# Patient Record
Sex: Female | Born: 1975
Health system: Southern US, Community
[De-identification: ages and names within clinical notes are randomized; demographics above are authoritative.]

## PROBLEM LIST (undated history)

## (undated) DIAGNOSIS — N926 Irregular menstruation, unspecified: Secondary | ICD-10-CM

## (undated) DIAGNOSIS — N920 Excessive and frequent menstruation with regular cycle: Secondary | ICD-10-CM

## (undated) DIAGNOSIS — E559 Vitamin D deficiency, unspecified: Secondary | ICD-10-CM

## (undated) DIAGNOSIS — G43909 Migraine, unspecified, not intractable, without status migrainosus: Secondary | ICD-10-CM

## (undated) DIAGNOSIS — D649 Anemia, unspecified: Secondary | ICD-10-CM

## (undated) HISTORY — DX: Anemia, unspecified: D64.9

## (undated) HISTORY — DX: Excessive and frequent menstruation with regular cycle: N92.0

## (undated) HISTORY — DX: Irregular menstruation, unspecified: N92.6

## (undated) HISTORY — DX: Migraine, unspecified, not intractable, without status migrainosus: G43.909

## (undated) HISTORY — DX: Vitamin D deficiency, unspecified: E55.9

## (undated) HISTORY — PX: SPINE SURGERY: SHX786

---
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
## 1998-06-01 HISTORY — PX: PILONIDAL CYST EXCISION: SHX744

## 1999-11-11 ENCOUNTER — Other Ambulatory Visit: Admission: RE | Admit: 1999-11-11 | Discharge: 1999-11-11 | Payer: Self-pay | Admitting: Gynecology

## 2001-01-06 ENCOUNTER — Other Ambulatory Visit: Admission: RE | Admit: 2001-01-06 | Discharge: 2001-01-06 | Payer: Self-pay | Admitting: Gynecology

## 2001-12-15 DIAGNOSIS — J309 Allergic rhinitis, unspecified: Secondary | ICD-10-CM | POA: Insufficient documentation

## 2004-03-22 ENCOUNTER — Inpatient Hospital Stay: Payer: Self-pay

## 2008-07-21 DIAGNOSIS — N926 Irregular menstruation, unspecified: Secondary | ICD-10-CM | POA: Insufficient documentation

## 2008-07-30 DIAGNOSIS — E559 Vitamin D deficiency, unspecified: Secondary | ICD-10-CM | POA: Insufficient documentation

## 2009-11-27 ENCOUNTER — Ambulatory Visit: Payer: Self-pay

## 2009-11-28 ENCOUNTER — Ambulatory Visit: Payer: Self-pay | Admitting: Family Medicine

## 2011-06-02 HISTORY — PX: DILATION AND CURETTAGE OF UTERUS: SHX78

## 2011-12-18 LAB — BASIC METABOLIC PANEL
BUN: 13 mg/dL (ref 4–21)
Creatinine: 0.8 mg/dL (ref <=1.1)
Glucose: 87 mg/dL
POTASSIUM: 3.7 mmol/L (ref 3.4–5.3)
Sodium: 138 mmol/L (ref 137–147)

## 2011-12-18 LAB — HEPATIC FUNCTION PANEL
ALT: 11 U/L (ref 7–35)
AST: 11 U/L — AB (ref 13–35)

## 2012-01-05 LAB — HM PAP SMEAR: HM Pap smear: NEGATIVE

## 2012-02-03 ENCOUNTER — Other Ambulatory Visit: Payer: Self-pay | Admitting: Physician Assistant

## 2012-02-03 LAB — CBC WITH DIFFERENTIAL/PLATELET
Basophil #: 0.1 10*3/uL (ref 0.0–0.1)
Basophil %: 1.1 %
Eosinophil #: 0.1 10*3/uL (ref 0.0–0.7)
HCT: 35.6 % (ref 35.0–47.0)
HGB: 12.1 g/dL (ref 12.0–16.0)
Lymphocyte #: 2.2 10*3/uL (ref 1.0–3.6)
Lymphocyte %: 32 %
MCV: 83 fL (ref 80–100)
Monocyte #: 0.4 x10 3/mm (ref 0.2–0.9)
Monocyte %: 6.2 %
Neutrophil %: 59.7 %
RBC: 4.31 10*6/uL (ref 3.80–5.20)
WBC: 7 10*3/uL (ref 3.6–11.0)

## 2013-08-18 LAB — CBC AND DIFFERENTIAL
HCT: 35 % — AB (ref 36–46)
Hemoglobin: 11.4 g/dL — AB (ref 12.0–16.0)
NEUTROS ABS: 4 /uL
PLATELETS: 385 10*3/uL (ref 150–399)
WBC: 6.7 10^3/mL

## 2015-08-02 ENCOUNTER — Telehealth: Payer: Self-pay | Admitting: Nurse Practitioner

## 2015-08-02 DIAGNOSIS — J111 Influenza due to unidentified influenza virus with other respiratory manifestations: Secondary | ICD-10-CM

## 2015-08-02 MED ORDER — OSELTAMIVIR PHOSPHATE 75 MG PO CAPS: 75.0000 mg | ORAL_CAPSULE | Freq: Two times a day (BID) | ORAL | Status: DC

## 2015-08-02 NOTE — Progress Notes (Signed)
E visit for Flu like symptoms   We are sorry that you are not feeling well.  Here is how we plan to help! Based on what you have shared with me it looks like you may have possible exposure to a virus that causes influenza.  Influenza or "the flu" is   an infection caused by a respiratory virus. The flu virus is highly contagious and persons who did not receive their yearly flu vaccination may "catch" the flu from close contact.  We have anti-viral medications to treat the viruses that cause this infection. They are not a "cure" and only shorten the course of the infection. These prescriptions are most effective when they are given within the first 2 days of "flu" symptoms. Antiviral medication are indicated if you have a high risk of complications from the flu. You should  also consider an antiviral medication if you are in close contact with someone who is at risk. These medications can help patients avoid complications from the flu  but have side effects that you should know. Possible side effects from Tamiflu or oseltamivir include nausea, vomiting, diarrhea, dizziness, headaches, eye redness, sleep problems or other respiratory symptoms. You should not take Tamiflu if you have an allergy to oseltamivir or any to the ingredients in Tamiflu.  Based upon your symptoms and potential risk factors I have prescribed Oseltamivir (Tamiflu).  It has been sent to your designated pharmacy.  You will take one 75 mg capsule orally twice a day for the next 5 days.  ANYONE WHO HAS FLU SYMPTOMS SHOULD: . Stay home. The flu is highly contagious and going out or to work exposes others! . Be sure to drink plenty of fluids. Water is fine as well as fruit juices, sodas and electrolyte beverages. You may want to stay away from caffeine or alcohol. If you are nauseated, try taking small sips of liquids. How do you know if you are getting enough fluid? Your urine should be a pale yellow or almost colorless. . Get  rest. . Taking a steamy shower or using a humidifier may help nasal congestion and ease sore throat pain. Using a saline nasal spray works much the same way. . Cough drops, hard candies and sore throat lozenges may ease your cough. . Line up a caregiver. Have someone check on you regularly.   GET HELP RIGHT AWAY IF: . You cannot keep down liquids or your medications. . You become short of breath . Your fell like you are going to pass out or loose consciousness. . Your symptoms persist after you have completed your treatment plan MAKE SURE YOU   Understand these instructions.  Will watch your condition.  Will get help right away if you are not doing well or get worse.  Your e-visit answers were reviewed by a board certified advanced clinical practitioner to complete your personal care plan.  Depending on the condition, your plan could have included both over the counter or prescription medications.  If there is a problem please reply  once you have received a response from your provider.  Your safety is important to [place].  If you have drug allergies check your prescription carefully.    You can use MyChart to ask questions about today's visit, request a non-urgent call back, or ask for a work or school excuse for 24 hours related to this e-Visit. If it has been greater than 24 hours you will need to follow up with your provider, or enter a new  e-Visit to address those concerns.  You will get an e-mail in the next two days asking about your experience.  I hope that your e-visit has been valuable and will speed your recovery. Thank you for using e-visits.

## 2015-09-03 DIAGNOSIS — D649 Anemia, unspecified: Secondary | ICD-10-CM | POA: Insufficient documentation

## 2015-09-04 ENCOUNTER — Ambulatory Visit (INDEPENDENT_AMBULATORY_CARE_PROVIDER_SITE_OTHER): Payer: BLUE CROSS/BLUE SHIELD | Admitting: Physician Assistant

## 2015-09-04 ENCOUNTER — Encounter: Payer: Self-pay | Admitting: Physician Assistant

## 2015-09-04 VITALS — BP 118/70 | HR 66 | Temp 98.4°F | Resp 16 | Wt 161.0 lb

## 2015-09-04 DIAGNOSIS — L509 Urticaria, unspecified: Secondary | ICD-10-CM

## 2015-09-04 MED ORDER — PREDNISONE 10 MG PO TABS: 10.0000 mg | ORAL_TABLET | Freq: Three times a day (TID) | ORAL | Status: DC

## 2015-09-04 MED ORDER — METHYLPREDNISOLONE ACETATE 80 MG/ML IJ SUSP
80.0000 mg | Freq: Once | INTRAMUSCULAR | Status: AC
  Administered 2015-09-04: 80 mg via INTRAMUSCULAR

## 2015-09-04 NOTE — Progress Notes (Signed)
       Patient: Diane Curry Female    DOB: 09-07-1975   40 y.o.   MRN: [id number] Visit Date: 09/04/2015  Today's Provider: Mar Daring, PA-C   Chief Complaint  Patient presents with  . Itching on arms and legs   Subjective:    HPI  Patient is here today with c/o her arms and legs itching, started Friday night. Sometimes she has white spots,sometimes red, but no rash visible. She has tried Claritin and Benadryl at night. She took some Prednisone that she had left together with the Claritin and it worked good. She try changing her laundry detergent to hypoallergenic and lotions since this started to see if there was any difference. This happened after she was free from any medication that she was taking. She doesn't feel like this is an allergic reaction. She states one of the worst times was when she got out of the shower she was very splotchy and itchy.      No Known Allergies Previous Medications   CELECOXIB (CELEBREX) 200 MG CAPSULE    Take by mouth. Reported on 09/04/2015   FEXOFENADINE (ALLEGRA ALLERGY) 180 MG TABLET    Take by mouth. Reported on 09/04/2015   MULTIPLE VITAMIN PO        Review of Systems  Constitutional: Negative.  Negative for fever, chills and fatigue.  Respiratory: Negative.   Cardiovascular: Negative.   Gastrointestinal: Negative.   Skin: Negative for rash.    Social History  Substance Use Topics  . Smoking status: Never Smoker   . Smokeless tobacco: Not on file  . Alcohol Use: Yes     Comment: Once per week; wine or mixed drinks   Objective:   BP 118/70 mmHg  Pulse 66  Temp(Src) 98.4 F (36.9 C) (Oral)  Resp 16  Wt 161 lb (73.029 kg)  LMP 09/04/2015  Physical Exam  Constitutional: She appears well-developed and well-nourished. No distress.  Cardiovascular: Normal rate, regular rhythm and normal heart sounds.  Exam reveals no gallop and no friction rub.   No murmur heard. Pulmonary/Chest: Effort normal and breath sounds normal. No  respiratory distress. She has no wheezes. She has no rales.  Skin: Skin is warm and dry. No rash noted. She is not diaphoretic. No erythema.  Currently asymptomatic  Vitals reviewed.       Assessment & Plan:     1. Urticaria Will give prednisone injection and taper as below for symptoms. She is to continue her anti-histamine as well. May be histamine-induced urticaria vs cholinergic induced urticaria (more likely due to response following shower). Advised to take luke warm showers to avoid response. She is to call if symptoms worsen or fail to improve. - predniSONE (DELTASONE) 10 MG tablet; Take 1 tablet (10 mg total) by mouth 3 (three) times daily with meals.  Dispense: 30 tablet; Refill: 0 - methylPREDNISolone acetate (DEPO-MEDROL) injection 80 mg; Inject 1 mL (80 mg total) into the muscle once.       Mar Daring, PA-C  [facility]

## 2015-09-04 NOTE — Patient Instructions (Signed)
Hives Hives are itchy, red, swollen areas of the skin. They can vary in size and location on your body. Hives can come and go for hours or several days (acute hives) or for several weeks (chronic hives). Hives do not spread from person to person (noncontagious). They may get worse with scratching, exercise, and emotional stress. CAUSES   Allergic reaction to food, additives, or drugs.  Infections, including the common cold.  Illness, such as vasculitis, lupus, or thyroid disease.  Exposure to sunlight, heat, or cold.  Exercise.  Stress.  Contact with chemicals. SYMPTOMS   Red or white swollen patches on the skin. The patches may change size, shape, and location quickly and repeatedly.  Itching.  Swelling of the hands, feet, and face. This may occur if hives develop deeper in the skin. DIAGNOSIS  Your caregiver can usually tell what is wrong by performing a physical exam. Skin or blood tests may also be done to determine the cause of your hives. In some cases, the cause cannot be determined. TREATMENT  Mild cases usually get better with medicines such as antihistamines. Severe cases may require an emergency epinephrine injection. If the cause of your hives is known, treatment includes avoiding that trigger.  HOME CARE INSTRUCTIONS   Avoid causes that trigger your hives.  Take antihistamines as directed by your caregiver to reduce the severity of your hives. Non-sedating or low-sedating antihistamines are usually recommended. Do not drive while taking an antihistamine.  Take any other medicines prescribed for itching as directed by your caregiver.  Wear loose-fitting clothing.  Keep all follow-up appointments as directed by your caregiver. SEEK MEDICAL CARE IF:   You have persistent or severe itching that is not relieved with medicine.  You have painful or swollen joints. SEEK IMMEDIATE MEDICAL CARE IF:   You have a fever.  Your tongue or lips are swollen.  You have  trouble breathing or swallowing.  You feel tightness in the throat or chest.  You have abdominal pain. These problems may be the first sign of a life-threatening allergic reaction. Call your local emergency services (911 in [place] MAKE SURE YOU:   Understand these instructions.  Will watch your condition.  Will get help right away if you are not doing well or get worse.   This information is not intended to replace advice given to you by your health care provider. Make sure you discuss any questions you have with your health care provider.   Document Released: 05/18/2005 Document Revised: 05/23/2013 Document Reviewed: 08/11/2011 Elsevier Interactive Patient Education [employer].

## 2016-05-01 ENCOUNTER — Encounter: Payer: Self-pay | Admitting: Physician Assistant

## 2016-05-07 ENCOUNTER — Ambulatory Visit (INDEPENDENT_AMBULATORY_CARE_PROVIDER_SITE_OTHER): Payer: BLUE CROSS/BLUE SHIELD | Admitting: Physician Assistant

## 2016-05-07 ENCOUNTER — Encounter: Payer: Self-pay | Admitting: Physician Assistant

## 2016-05-07 DIAGNOSIS — M419 Scoliosis, unspecified: Secondary | ICD-10-CM | POA: Diagnosis not present

## 2016-05-07 DIAGNOSIS — M542 Cervicalgia: Secondary | ICD-10-CM

## 2016-05-07 MED ORDER — MELOXICAM 7.5 MG PO TABS: 7.5000 mg | ORAL_TABLET | Freq: Every day | ORAL | 0 refills | Status: AC

## 2016-05-07 MED ORDER — CYCLOBENZAPRINE HCL 10 MG PO TABS: 10.0000 mg | ORAL_TABLET | Freq: Every day | ORAL | 0 refills | Status: AC
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
## 2016-05-07 NOTE — Patient Instructions (Signed)
Cervical Sprain A cervical sprain is a stretch or tear in the tissues that connect bones (ligaments) in the neck. Most neck (cervical) sprains get better in 4-6 weeks. Follow these instructions at home: If you have a neck collar:  Wear it as told by your doctor. Do not take off (do not remove) the collar unless your doctor says that this is safe.  Ask your doctor before adjusting your collar.  If you have long hair, keep it outside of the collar.  Ask your doctor if you may take off the collar for cleaning and bathing. If you may take off the collar:  Follow instructions from your doctor about how to take off the collar safely.  Clean the collar by wiping it with mild soap and water. Let it air-dry all the way.  If your collar has removable pads:  Take the pads out every 1-2 days.  Hand wash the pads with soap and water.  Let the pads air-dry all the way before you put them back in the collar. Do not dry them in a clothes dryer. Do not dry them with a hair dryer.  Check your skin under the collar for irritation or sores. If you see any, tell your doctor. Managing pain, stiffness, and swelling  Use a cervical traction device, if told by your doctor.  If told, put heat on the affected area. Do this before exercises (physical therapy) or as often as told by your doctor. Use the heat source that your doctor recommends, such as a moist heat pack or a heating pad.  Place a towel between your skin and the heat source.  Leave the heat on for 20-30 minutes.  Take the heat off (remove the heat) if your skin turns bright red. This is very important if you cannot feel pain, heat, or cold. You may have a greater risk of getting burned.  Put ice on the affected area.  Put ice in a plastic bag.  Place a towel between your skin and the bag.  Leave the ice on for 20 minutes, 2-3 times a day. Activity  Do not drive while wearing a neck collar. If you do not have a neck collar, ask your  doctor if it is safe to drive.  Do not drive or use heavy machinery while taking prescription pain medicine or muscle relaxants, unless your doctor approves.  Do not lift anything that is heavier than 10 lb (4.5 kg) until your doctor tells you that it is safe.  Rest as told by your doctor.  Avoid activities that make you feel worse. Ask your doctor what activities are safe for you.  Do exercises as told by your doctor or physical therapist. Preventing neck sprain  Practice good posture. Adjust your workstation to help with this, if needed.  Exercise regularly as told by your doctor or physical therapist.  Avoid activities that are risky or may cause a neck sprain (cervical sprain). General instructions  Take over-the-counter and prescription medicines only as told by your doctor.  Do not use any products that contain nicotine or tobacco. This includes cigarettes and e-cigarettes. If you need help quitting, ask your doctor.  Keep all follow-up visits as told by your doctor. This is important. Contact a doctor if:  You have pain or other symptoms that get worse.  You have symptoms that do not get better after 2 weeks.  You have pain that does not get better with medicine.  You start to have new,  unexplained symptoms.  You have sores or irritated skin from wearing your neck collar. Get help right away if:  You have very bad pain.  You have any of the following in any part of your body:  Loss of feeling (numbness).  Tingling.  Weakness.  You cannot move a part of your body (you have paralysis).  Your activity level does not improve. Summary  A cervical sprain is a stretch or tear in the tissues that connect bones (ligaments) in the neck.  If you have a neck (cervical) collar, do not take off the collar unless your doctor says that this is safe.  Put ice on affected areas as told by your doctor.  Put heat on affected areas as told by your doctor.  Good posture  and regular exercise can help prevent a neck sprain from happening again. This information is not intended to replace advice given to you by your health care provider. Make sure you discuss any questions you have with your health care provider. Document Released: 11/04/2007 Document Revised: 01/28/2016 Document Reviewed: 01/28/2016 Elsevier Interactive Patient Education  2017 [employer].

## 2016-05-08 ENCOUNTER — Ambulatory Visit: Payer: BLUE CROSS/BLUE SHIELD | Admitting: Physician Assistant

## 2016-09-11 ENCOUNTER — Encounter (HOSPITAL_COMMUNITY): Payer: Self-pay | Admitting: *Deleted

## 2016-09-11 ENCOUNTER — Emergency Department (HOSPITAL_COMMUNITY)
Admission: EM | Admit: 2016-09-11 | Discharge: 2016-09-11 | Disposition: A | Discharge status: Home / Self Care | Payer: BLUE CROSS/BLUE SHIELD | Attending: Emergency Medicine | Admitting: Emergency Medicine

## 2016-09-11 DIAGNOSIS — Y9301 Activity, walking, marching and hiking: Secondary | ICD-10-CM | POA: Diagnosis not present

## 2016-09-11 DIAGNOSIS — Z79899 Other long term (current) drug therapy: Secondary | ICD-10-CM | POA: Insufficient documentation

## 2016-09-11 DIAGNOSIS — Y999 Unspecified external cause status: Secondary | ICD-10-CM | POA: Diagnosis not present

## 2016-09-11 DIAGNOSIS — S6991XA Unspecified injury of right wrist, hand and finger(s), initial encounter: Secondary | ICD-10-CM | POA: Diagnosis not present

## 2016-09-11 DIAGNOSIS — Y929 Unspecified place or not applicable: Secondary | ICD-10-CM | POA: Insufficient documentation

## 2016-09-11 DIAGNOSIS — W231XXA Caught, crushed, jammed, or pinched between stationary objects, initial encounter: Secondary | ICD-10-CM | POA: Insufficient documentation

## 2016-09-11 DIAGNOSIS — S61205A Unspecified open wound of left ring finger without damage to nail, initial encounter: Secondary | ICD-10-CM | POA: Diagnosis not present

## 2016-09-11 MED ORDER — BACITRACIN ZINC 500 UNIT/GM EX OINT
TOPICAL_OINTMENT | Freq: Two times a day (BID) | CUTANEOUS | Status: DC
  Administered 2016-09-11: 11:00:00 via TOPICAL

## 2016-09-11 MED ORDER — LIDOCAINE HCL (PF) 1 % IJ SOLN
30.0000 mL | Freq: Once | INTRAMUSCULAR | Status: AC
  Administered 2016-09-11: 30 mL
  Filled 2016-09-11: qty 30

## 2016-09-11 MED ORDER — IBUPROFEN 400 MG PO TABS
600.0000 mg | ORAL_TABLET | Freq: Once | ORAL | Status: DC
  Filled 2016-09-11: qty 1

## 2016-09-11 NOTE — ED Provider Notes (Signed)
[facility] DEPT Provider Note   CSN: [id number] Arrival date & time: 09/11/16  0919     History   Chief Complaint Chief Complaint  Patient presents with  . Finger Injury    HPI Diane Curry is a 41 y.o. female.  HPI 41 year old Caucasian female presents to the ED today with complaints of injury to her left ring finger. Patient states that she was walking her dog when the dog pulled the leash causing a small abrasion to her left ring finger. Immediate swelling was noted. Patient was unable to get her rings off. She was afraid that the swelling will not go down and she be unable to get her rings off presented to the ED today. Patient denies any paresthesias. Denies any weakness or signs of infection. Patient is up-to-date on her tdap. Took Motrin prior to arrival. Denies any other complaints. Past Medical History:  Diagnosis Date  . Anemia   . Menstrual periods irregular   . Vitamin D deficiency     Patient Active Problem List   Diagnosis Date Noted  . Absolute anemia 09/03/2015  . Avitaminosis D 07/30/2008  . Irregular bleeding 07/21/2008  . Allergic rhinitis 12/15/2001    Past Surgical History:  Procedure Laterality Date  . DILATION AND CURETTAGE OF UTERUS  2013   SAB at 10-12 weeks, warranted PRBC and admission overnight. [place], [place]  . PILONIDAL CYST EXCISION  2000   Dr.Byrnette  . TONSILLECTOMY  1992    OB History    No data available       Home Medications    Prior to Admission medications   Medication Sig Start Date End Date Taking? Authorizing Provider  ferrous sulfate 325 (65 FE) MG EC tablet TAKE 1 TABLET BY MOUTH TWICE A DAY TAKE WITH FOOD 03/12/16   Historical Provider, MD  loratadine (CLARITIN) 10 MG tablet Take 10 mg by mouth daily.    Historical Provider, MD  MULTIPLE VITAMIN PO  07/17/08   Historical Provider, MD    Family History Family History  Problem Relation Age of Onset  . Osteoporosis Mother   . Diabetes Father     non-  insulin Dependent  . Hypertension Father     Social History Social History  Substance Use Topics  . Smoking status: Never Smoker  . Smokeless tobacco: Never Used  . Alcohol use Yes     Comment: Once per week; wine or mixed drinks     Allergies   Patient has no known allergies.   Review of Systems Review of Systems  Constitutional: Negative for chills and fever.  Musculoskeletal: Positive for arthralgias and joint swelling.  Skin: Positive for wound.  Neurological: Negative for weakness and numbness.     Physical Exam Updated Vital Signs BP 127/81 (BP Location: Right Arm)   Pulse 86   Temp 98.2 F (36.8 C) (Oral)   Resp 20   LMP 08/29/2016   SpO2 97%   Physical Exam  Constitutional: She appears well-developed and well-nourished. No distress.  Eyes: Right eye exhibits no discharge. Left eye exhibits no discharge. No scleral icterus.  Pulmonary/Chest: No respiratory distress.  Musculoskeletal: Normal range of motion.  Patient with small avulsion to the proximal left ring finger. No laceration or open wound noted. Bleeding is controlled. Edema noted to the proximal left ring finger. Unable to remove the rings. Full range of motion of the DIP and PIP along with MC joint. No pain with movement. Cap refill is normal. Sensation intact. Radial  pulses 2+ bilaterally.  Neurological: She is alert.  Skin: Skin is warm and dry. Capillary refill takes less than 2 seconds. No pallor.  Small abrasion noted to the left ring finger bleeding controlled. No signs of infection.  Nursing note and vitals reviewed.    ED Treatments / Results  Labs (all labs ordered are listed, but only abnormal results are displayed) Labs Reviewed - No data to display  EKG  EKG Interpretation None       Radiology No results found.  Procedures Patient presents with edema to the left ring finger needing the rings removed. There was swelling and rings were unable to be removed. Ring-cutting was  indicated. Both rings were cut using a hand-held device. Rings were removed no further consultations were noted.    Archie Endo Block Date/Time: 09/11/2016 11:02 AM Performed by: Doristine Devoid Authorized by: Ocie Cornfield T   Consent:    Consent obtained:  Verbal   Consent given by:  Patient   Risks discussed:  Allergic reaction, intravenous injection, infection, nerve damage, pain, swelling and unsuccessful block   Alternatives discussed:  No treatment Indications:    Indications:  Pain relief and procedural anesthesia Location:    Body area:  Upper extremity   Upper extremity nerve blocked: digit.   Laterality:  Left Pre-procedure details:    Skin preparation:  Povidone-iodine Procedure details (see MAR for exact dosages):    Block needle gauge:  27 G   Anesthetic injected:  Lidocaine 1% w/o epi   Steroid injected:  None   Additive injected:  None   Injection procedure:  Anatomic landmarks identified, anatomic landmarks palpated, introduced needle, incremental injection and negative aspiration for blood   Paresthesia:  Immediately resolved Post-procedure details:    Outcome:  Anesthesia achieved   Patient tolerance of procedure:  Tolerated well, no immediate complications   (including critical care time)       Medications Ordered in ED Medications  ibuprofen (ADVIL,MOTRIN) tablet 600 mg (600 mg Oral Not Given 09/11/16 0945)  bacitracin ointment (not administered)  lidocaine (PF) (XYLOCAINE) 1 % injection 30 mL (30 mLs Infiltration Given 09/11/16 0945)     Initial Impression / Assessment and Plan / ED Course  I have reviewed the triage vital signs and the nursing notes.  Pertinent labs & imaging results that were available during my care of the patient were reviewed by me and considered in my medical decision making (see chart for details).     Patient presents to the ED with abrasion to her left ring finger and swelling unable to remove her wedding rings.  Tried to reduce swelling with ice, Motrin, tying the finger was straining name to remove the ring. Digital block was performed to achieved anesthesia for procedure. Use the ring cutters to remove the rings. Patient tolerated well without any further complications. She is neurovascularly intact. Full range of motion. Wound was cleaned and went about ointment applied. Dressing was applied. Patient verbalized understanding the plan of care. All questions were answered prior to discharge. Strict return precautions discussed.  Final Clinical Impressions(s) / ED Diagnoses   Final diagnoses:  Injury of finger of right hand, initial encounter    New Prescriptions New Prescriptions   No medications on file     Doristine Devoid, PA-C 09/11/16 [place], MD 09/12/16 [phone]

## 2016-09-11 NOTE — ED Notes (Signed)
Pt is in stable condition upon d/c and ambulates from ED. 

## 2016-09-11 NOTE — Discharge Instructions (Signed)
Please keep antibiotic ointment applied to the wound. Rest, ice, elevate the right hand. Dressing changes daily. Motrin for pain and swelling. Follow up primary care doctor symptoms not improved.

## 2016-09-11 NOTE — ED Triage Notes (Signed)
Pt was walking dog and skin on L ring finger was ripped - not an avulsion.  Needs to have rings removed.

## 2016-11-03 ENCOUNTER — Encounter: Payer: Self-pay | Admitting: Family Medicine

## 2016-11-03 DIAGNOSIS — Z1151 Encounter for screening for human papillomavirus (HPV): Secondary | ICD-10-CM | POA: Diagnosis not present

## 2016-11-03 DIAGNOSIS — Z01419 Encounter for gynecological examination (general) (routine) without abnormal findings: Secondary | ICD-10-CM | POA: Diagnosis not present

## 2016-11-03 DIAGNOSIS — Z1231 Encounter for screening mammogram for malignant neoplasm of breast: Secondary | ICD-10-CM | POA: Diagnosis not present

## 2016-11-03 DIAGNOSIS — Z6828 Body mass index (BMI) 28.0-28.9, adult: Secondary | ICD-10-CM | POA: Diagnosis not present

## 2016-11-03 LAB — HM PAP SMEAR: HM Pap smear: NEGATIVE

## 2016-12-24 ENCOUNTER — Ambulatory Visit (INDEPENDENT_AMBULATORY_CARE_PROVIDER_SITE_OTHER): Payer: BLUE CROSS/BLUE SHIELD | Admitting: Neurology

## 2016-12-24 ENCOUNTER — Encounter: Payer: Self-pay | Admitting: Neurology

## 2016-12-24 VITALS — Ht 63.0 in | Wt 158.0 lb

## 2016-12-24 DIAGNOSIS — G43009 Migraine without aura, not intractable, without status migrainosus: Secondary | ICD-10-CM

## 2016-12-24 DIAGNOSIS — D508 Other iron deficiency anemias: Secondary | ICD-10-CM

## 2016-12-24 DIAGNOSIS — E784 Other hyperlipidemia: Secondary | ICD-10-CM

## 2016-12-24 DIAGNOSIS — E7849 Other hyperlipidemia: Secondary | ICD-10-CM

## 2016-12-24 MED ORDER — SUMATRIPTAN SUCCINATE 100 MG PO TABS: 100.0000 mg | ORAL_TABLET | Freq: Once | ORAL | 12 refills | Status: DC | PRN

## 2016-12-24 NOTE — Patient Instructions (Signed)
Remember to drink plenty of fluid, eat healthy meals and do not skip any meals. Try to eat protein with a every meal and eat a healthy snack such as fruit or nuts in between meals. Try to keep a regular sleep-wake schedule and try to exercise daily, particularly in the form of walking, 20-30 minutes a day, if you can.   As far as your medications are concerned, I would like to suggest:  Imitrex: Please take one tablet at the onset of your headache. If it does not improve the symptoms please take one additional tablet in 2 hours. Do not take more then 2 tablets in 24hrs. Do not take use more then 2 to 3 times in a week.  As far as diagnostic testing: Labs  I would like to see you back as needed, sooner if we need to. Please call us with any interim questions, concerns, problems, updates or refill requests.   Our phone number is [phone]. We also have an after hours call service for urgent matters and there is a physician on-call for urgent questions. For any emergencies you know to call 911 or go to the nearest emergency room  Sumatriptan tablets What is this medicine? SUMATRIPTAN (soo ma TRIP tan) is used to treat migraines with or without aura. An aura is a strange feeling or visual disturbance that warns you of an attack. It is not used to prevent migraines. This medicine may be used for other purposes; ask your health care provider or pharmacist if you have questions. COMMON BRAND NAME(S): Imitrex, Migraine Pack What should I tell my health care provider before I take this medicine? They need to know if you have any of these conditions: -circulation problems in fingers and toes -diabetes -heart disease -high blood pressure -high cholesterol -history of irregular heartbeat -history of stroke -kidney disease -liver disease -postmenopausal or surgical removal of uterus and ovaries -seizures -smoke tobacco -stomach or intestine problems -an unusual or allergic reaction to  sumatriptan, other medicines, foods, dyes, or preservatives -pregnant or trying to get pregnant -breast-feeding How should I use this medicine? Take this medicine by mouth with a glass of water. Follow the directions on the prescription label. This medicine is taken at the first symptoms of a migraine. It is not for everyday use. If your migraine headache returns after one dose, you can take another dose as directed. You must leave at least 2 hours between doses, and do not take more than 100 mg as a single dose. Do not take more than 200 mg total in any 24 hour period. If there is no improvement at all after the first dose, do not take a second dose without talking to your doctor or health care professional. Do not take your medicine more often than directed. Talk to your pediatrician regarding the use of this medicine in children. Special care may be needed. Overdosage: If you think you have taken too much of this medicine contact a poison control center or emergency room at once. NOTE: This medicine is only for you. Do not share this medicine with others. What if I miss a dose? This does not apply; this medicine is not for regular use. What may interact with this medicine? Do not take this medicine with any of the following medicines: -cocaine -ergot alkaloids like dihydroergotamine, ergonovine, ergotamine, methylergonovine -feverfew -MAOIs like Carbex, Eldepryl, Marplan, Nardil, and Parnate -other medicines for migraine headache like almotriptan, eletriptan, frovatriptan, naratriptan, rizatriptan, zolmitriptan -tryptophan This medicine may also interact  with the following medications: -certain medicines for depression, anxiety, or psychotic disturbances This list may not describe all possible interactions. Give your health care provider a list of all the medicines, herbs, non-prescription drugs, or dietary supplements you use. Also tell them if you smoke, drink alcohol, or use illegal drugs.  Some items may interact with your medicine. What should I watch for while using this medicine? Only take this medicine for a migraine headache. Take it if you get warning symptoms or at the start of a migraine attack. It is not for regular use to prevent migraine attacks. You may get drowsy or dizzy. Do not drive, use machinery, or do anything that needs mental alertness until you know how this medicine affects you. To reduce dizzy or fainting spells, do not sit or stand up quickly, especially if you are an older patient. Alcohol can increase drowsiness, dizziness and flushing. Avoid alcoholic drinks. Smoking cigarettes may increase the risk of heart-related side effects from using this medicine. If you take migraine medicines for 10 or more days a month, your migraines may get worse. Keep a diary of headache days and medicine use. Contact your healthcare professional if your migraine attacks occur more frequently. What side effects may I notice from receiving this medicine? Side effects that you should report to your doctor or health care professional as soon as possible: -allergic reactions like skin rash, itching or hives, swelling of the face, lips, or tongue -bloody or watery diarrhea -hallucination, loss of contact with reality -pain, tingling, numbness in the face, hands, or feet -seizures -signs and symptoms of a blood clot such as breathing problems; changes in vision; chest pain; severe, sudden headache; pain, swelling, warmth in the leg; trouble speaking; sudden numbness or weakness of the face, arm, or leg -signs and symptoms of a dangerous change in heartbeat or heart rhythm like chest pain; dizziness; fast or irregular heartbeat; palpitations, feeling faint or lightheaded; falls; breathing problems -signs and symptoms of a stroke like changes in vision; confusion; trouble speaking or understanding; severe headaches; sudden numbness or weakness of the face, arm, or leg; trouble walking;  dizziness; loss of balance or coordination -stomach pain Side effects that usually do not require medical attention (report to your doctor or health care professional if they continue or are bothersome): -changes in taste -facial flushing -headache -muscle cramps -muscle pain -nausea, vomiting -weak or tired This list may not describe all possible side effects. Call your doctor for medical advice about side effects. You may report side effects to FDA at 1-800-FDA-1088. Where should I keep my medicine? Keep out of the reach of children. Store at room temperature between 2 and 30 degrees C (36 and 86 degrees F). Throw away any unused medicine after the expiration date. NOTE: This sheet is a summary. It may not cover all possible information. If you have questions about this medicine, talk to your doctor, pharmacist, or health care provider.  2018 Elsevier/Gold Standard (2015-06-20 12:38:23)

## 2016-12-24 NOTE — Progress Notes (Signed)
[facility]    Provider:  Dr Jaynee Eagles Referring Provider: Trinna Post, PA-C Primary Care Physician:  Paulene Floor  CC:  headaches  HPI:  Diane Curry is a 41 y.o. female here as a referral from Dr. Terrilee Croak for headaches. 12-18 months ago headaches started worsening, more frequent and intense. Sister with migraines. Worse with barometric presure and hormones like at the start of her period. She had headaches for a whole week during her period. She has tried tylenol, alleve. Caffeine helped. Headaches start at the base of her neck, worse on the left side of her head, pounding through her temples, behind the eye, she has nausea occ, no aura, she endorses light sensitivity, sound sensitivity, moving makes it pound more. Can last 4 hours to days. Rest helps but she may wake up with the headache. Over the last 4 months she has about 7 days a month of migraines. No other focal neurologic deficits, associated symptoms, inciting events or modifiable factors. No vision changes, no fever, no weakness.  Review of Systems: Patient complains of symptoms per HPI as well as the following symptoms: No CP, no SOB. Pertinent negatives and positives per HPI. All others negative.   Social History   Social History  . Marital status: Married    Spouse name: N/A  . Number of children: 3  . Years of education: 43   Occupational History  .      [employer]   Social History Main Topics  . Smoking status: Never Smoker  . Smokeless tobacco: Never Used  . Alcohol use Yes     Comment: Once per week; wine or mixed drinks  . Drug use: No  . Sexual activity: Yes   Other Topics Concern  . Not on file   Social History Narrative   Lives with family   Caffeine, 1 daily    Family History  Problem Relation Age of Onset  . Osteoporosis Mother   . Diabetes Father        non- insulin Dependent  . Hypertension Father   . Hypertension Maternal Grandfather   . Cancer  Paternal Grandmother        breast  . Stroke Paternal Grandfather   . Hypertension Paternal Grandfather     Past Medical History:  Diagnosis Date  . Anemia   . Menstrual periods irregular   . Vitamin D deficiency     Past Surgical History:  Procedure Laterality Date  . DILATION AND CURETTAGE OF UTERUS  2013   SAB at 10-12 weeks, warranted PRBC and admission overnight. [place], [place]  . [place]  1987  . PILONIDAL CYST EXCISION  2000   Dr.Byrnette  . TONSILLECTOMY  1992    Current Outpatient Prescriptions  Medication Sig Dispense Refill  . acetaminophen (TYLENOL) 325 MG tablet Take 650 mg by mouth every 6 (six) hours as needed.    Marland Kitchen ibuprofen (ADVIL,MOTRIN) 200 MG tablet Take 200 mg by mouth every 6 (six) hours as needed.    . loratadine (CLARITIN) 10 MG tablet Take 10 mg by mouth daily.    . MULTIPLE VITAMIN PO     . ferrous sulfate 325 (65 FE) MG EC tablet TAKE 1 TABLET BY MOUTH TWICE A DAY TAKE WITH FOOD  1  . SUMAtriptan (IMITREX) 100 MG tablet Take 1 tablet (100 mg total) by mouth once as needed. May repeat in 2 hours if headache persists or recurs. 10 tablet 12   No current  facility-administered medications for this visit.     Allergies as of 12/24/2016  . (No Known Allergies)    Vitals: Ht 5' 3"  (1.6 m)   Wt 158 lb (71.7 kg)   BMI 27.99 kg/m  Last Weight:  Wt Readings from Last 1 Encounters:  12/24/16 158 lb (71.7 kg)   Last Height:   Ht Readings from Last 1 Encounters:  12/24/16 5' 3"  (1.6 m)  Physical exam: Exam: Gen: NAD, conversant, well nourised, well groomed                     CV: RRR, no MRG. No Carotid Bruits. No peripheral edema, warm, nontender Eyes: Conjunctivae clear without exudates or hemorrhage  Neuro: Detailed Neurologic Exam  Speech:    Speech is normal; fluent and spontaneous with normal comprehension.  Cognition:    The patient is oriented to person, place, and time;     recent and remote memory intact;     language  fluent;     normal attention, concentration,     fund of knowledge Cranial Nerves:    The pupils are equal, round, and reactive to light. The fundi are normal and spontaneous venous pulsations are present. Visual fields are full to finger confrontation. Extraocular movements are intact. Trigeminal sensation is intact and the muscles of mastication are normal. The face is symmetric. The palate elevates in the midline. Hearing intact. Voice is normal. Shoulder shrug is normal. The tongue has normal motion without fasciculations.   Coordination:    Normal finger to nose and heel to shin. Normal rapid alternating movements.   Gait:    Heel-toe and tandem gait are normal.   Motor Observation:    No asymmetry, no atrophy, and no involuntary movements noted. Tone:    Normal muscle tone.    Posture:    Posture is normal. normal erect    Strength:    Strength is V/V in the upper and lower limbs.      Sensation: intact to LT     Reflex Exam:  DTR's:    Deep tendon reflexes in the upper and lower extremities are normal bilaterally.   Toes:    The toes are downgoing bilaterally.   Clonus:    Clonus is absent.        Assessment/Plan:  41 year old with migraines without aura.  As far as your medications are concerned, I would like to suggest:  Imitrex: Please take one tablet at the onset of your headache. If it does not improve the symptoms please take one additional tablet in 2 hours. Do not take more then 2 tablets in 24hrs. Do not take use more then 2 to 3 times in a week.  As far as diagnostic testing: Labs  Discussed preventative medications, she would like to hold off at this time  Discussed: To prevent or relieve headaches, try the following: Cool Compress. Lie down and place a cool compress on your head.  Avoid headache triggers. If certain foods or odors seem to have triggered your migraines in the past, avoid them. A headache diary might help you identify triggers.    Include physical activity in your daily routine. Try a daily walk or other moderate aerobic exercise.  Manage stress. Find healthy ways to cope with the stressors, such as delegating tasks on your to-do list.  Practice relaxation techniques. Try deep breathing, yoga, massage and visualization.  Eat regularly. Eating regularly scheduled meals and maintaining a healthy diet  might help prevent headaches. Also, drink plenty of fluids.  Follow a regular sleep schedule. Sleep deprivation might contribute to headaches Consider biofeedback. With this mind-body technique, you learn to control certain bodily functions - such as muscle tension, heart rate and blood pressure - to prevent headaches or reduce headache pain.    Proceed to emergency room if you experience new or worsening symptoms or symptoms do not resolve, if you have new neurologic symptoms or if headache is severe, or for any concerning symptom.   Provided education and documentation from American headache Society toolbox including articles on: chronic migraine medication overuse headache, chronic migraines, prevention of migraines, behavioral and other nonpharmacologic treatments for headache.    Orders Placed This Encounter  Procedures  . CBC  . Comprehensive metabolic panel  . Ferritin  . TSH  . Lipid panel   Cc: Trinna Post, PA-C   A total of 40 minutes was spent  Face-tp-face in with this patient. Over half this time was spent on counseling patient on the migraine diagnosis and different therapeutic options available.   Sarina Ill, MD  [facility] [address] [place], [place] [address]  Phone [phone] Fax [phone]

## 2016-12-25 ENCOUNTER — Telehealth: Payer: Self-pay | Admitting: *Deleted

## 2016-12-25 LAB — COMPREHENSIVE METABOLIC PANEL
A/G RATIO: 1.7 (ref 1.2–2.2)
ALT: 9 IU/L (ref 0–32)
AST: 15 IU/L (ref 0–40)
Albumin: 4.4 g/dL (ref 3.5–5.5)
Alkaline Phosphatase: 59 IU/L (ref 39–117)
BUN/Creatinine Ratio: 12 (ref 9–23)
BUN: 9 mg/dL (ref 6–24)
Bilirubin Total: 0.3 mg/dL (ref 0.0–1.2)
CALCIUM: 9.1 mg/dL (ref 8.7–10.2)
CO2: 22 mmol/L (ref 20–29)
CREATININE: 0.77 mg/dL (ref 0.57–1.00)
Chloride: 103 mmol/L (ref 96–106)
GFR, EST AFRICAN AMERICAN: 110 mL/min/{1.73_m2} (ref >59)
GFR, EST NON AFRICAN AMERICAN: 96 mL/min/{1.73_m2} (ref >59)
Globulin, Total: 2.6 g/dL (ref 1.5–4.5)
Glucose: 83 mg/dL (ref 65–99)
Potassium: 4.4 mmol/L (ref 3.5–5.2)
Sodium: 138 mmol/L (ref 134–144)
TOTAL PROTEIN: 7 g/dL (ref 6.0–8.5)

## 2016-12-25 LAB — LIPID PANEL
CHOLESTEROL TOTAL: 169 mg/dL (ref 100–199)
Chol/HDL Ratio: 3.7 ratio (ref 0.0–4.4)
HDL: 46 mg/dL (ref >39)
LDL CALC: 104 mg/dL — AB (ref 0–99)
Triglycerides: 94 mg/dL (ref 0–149)
VLDL CHOLESTEROL CAL: 19 mg/dL (ref 5–40)

## 2016-12-25 LAB — CBC
HEMATOCRIT: 34.9 % (ref 34.0–46.6)
Hemoglobin: 10.9 g/dL — ABNORMAL LOW (ref 11.1–15.9)
MCH: 25.8 pg — ABNORMAL LOW (ref 26.6–33.0)
MCHC: 31.2 g/dL — ABNORMAL LOW (ref 31.5–35.7)
MCV: 83 fL (ref 79–97)
Platelets: 380 10*3/uL — ABNORMAL HIGH (ref 150–379)
RBC: 4.23 x10E6/uL (ref 3.77–5.28)
RDW: 15.7 % — AB (ref 12.3–15.4)
WBC: 6.5 10*3/uL (ref 3.4–10.8)

## 2016-12-25 LAB — TSH: TSH: 1.73 u[IU]/mL (ref 0.450–4.500)

## 2016-12-25 LAB — FERRITIN: Ferritin: 10 ng/mL — ABNORMAL LOW (ref 15–150)

## 2016-12-25 NOTE — Telephone Encounter (Signed)
-----   Message from Melvenia Beam, MD sent at 12/25/2016  8:06 AM EDT ----- Patient is anemic (hgb 10.9) and her ferritin is extremely low at 10 ( we like to see it above 75). Her LDL is 104 which is fine.  Ferritin is very low. Studies have shown that in patients whose serum ferritin was < 75 g/l, oral iron therapy (325 mg ferrous sulfate twice a day on an empty stomach) on average improved RLS symptom after 3 months. Iron should be given on an empty stomach an hour before eating or two hours after eating and if possible given along with 100-200 mg of vitamin C which helps with absorption. Patient should follow up with primary care next appointment and recheck ferritin to ensure it has improved.

## 2016-12-25 NOTE — Telephone Encounter (Signed)
I have spoken with Diane Curry and per AA, reviewed lab results as below, and given rec. of Fe 381m bid on an empty stomach 1 hr. before eating.  LCarloverbalized understanding of same and sts. will start Fe supp. today, f/u with pcp for repeat labs/fim

## 2016-12-26 DIAGNOSIS — G43909 Migraine, unspecified, not intractable, without status migrainosus: Secondary | ICD-10-CM | POA: Insufficient documentation

## 2017-03-24 DIAGNOSIS — D485 Neoplasm of uncertain behavior of skin: Secondary | ICD-10-CM | POA: Diagnosis not present

## 2017-03-24 DIAGNOSIS — L578 Other skin changes due to chronic exposure to nonionizing radiation: Secondary | ICD-10-CM | POA: Diagnosis not present

## 2017-03-24 DIAGNOSIS — L821 Other seborrheic keratosis: Secondary | ICD-10-CM | POA: Diagnosis not present

## 2017-05-04 DIAGNOSIS — D485 Neoplasm of uncertain behavior of skin: Secondary | ICD-10-CM | POA: Diagnosis not present

## 2017-06-08 DIAGNOSIS — D485 Neoplasm of uncertain behavior of skin: Secondary | ICD-10-CM | POA: Diagnosis not present

## 2017-06-08 DIAGNOSIS — D229 Melanocytic nevi, unspecified: Secondary | ICD-10-CM | POA: Diagnosis not present

## 2017-11-22 ENCOUNTER — Encounter: Payer: Self-pay | Admitting: Medical

## 2017-11-22 ENCOUNTER — Ambulatory Visit: Payer: BLUE CROSS/BLUE SHIELD | Admitting: Medical

## 2017-11-22 VITALS — BP 134/86 | HR 83 | Temp 99.1°F | Resp 16 | Wt 160.8 lb

## 2017-11-22 DIAGNOSIS — B349 Viral infection, unspecified: Secondary | ICD-10-CM

## 2017-11-22 NOTE — Progress Notes (Signed)
Subjective:    Patient ID: Diane Curry, female    DOB: 02-Jun-1975, 42 y.o.   MRN: [id number]  HPI  42 yo female in non acute distress. Presents today with complaints of   Sore throat x 3 days "it feels as if it is worse" and  loose stool  X 2 days.  Feels "really run down" tenderness on the  Back of head at the lymph nodes.  Taking Advil regularly  every 6 hours, last dose  11am today.  History of possible mono 10 years but not sure because of test results..   Thinks she had a sinus infection  2 weeks ago  but that resolved.   Did get Flu vaccine, Works at [employer] in [place] care [place]. Denies any exposure to illness other then possibly when staying in a hotel room.  Declines pregnancy test but ,not using birth control.   lmp 11/06/2017  Review of Systems  Constitutional: Negative for activity change (decreased), appetite change (decreased), chills and fever.  HENT: Positive for ear pain (" alittle  Left> right") and sore throat ("tickle at the back of the throat , painful"). Negative for postnasal drip, rhinorrhea, sinus pressure, sinus pain and sneezing.   Eyes: Negative for discharge and itching.  Respiratory: Negative for cough and shortness of breath.   Cardiovascular: Negative for chest pain and leg swelling.  Gastrointestinal: Positive for nausea ("a low level comes and goes" ). Negative for abdominal pain and diarrhea.  Endocrine: Negative for polydipsia, polyphagia and polyuria.  Genitourinary: Negative for dysuria.  Musculoskeletal: Positive for myalgias.  Skin: Negative for rash.  Allergic/Immunologic: Positive for environmental allergies. Negative for food allergies.  Neurological: Negative for dizziness, syncope and light-headedness.  Hematological: Positive for adenopathy (back of head).  Psychiatric/Behavioral: Negative for behavioral problems, self-injury and suicidal ideas. The patient is not nervous/anxious.    Feels Claritin is working Uses Imitrex  when she has her period.  History of Anemia treated one year ago. Due for blood work Has appointment with her doctor next month. " I usually recognize the symptoms of anemia., I am taking a MVI with iron so I feel it is not that".  Denies any tick bites this year.    Objective:   Physical Exam  Constitutional: She is oriented to person, place, and time. She appears well-developed and well-nourished.  HENT:  Head: Normocephalic and atraumatic.  Right Ear: Hearing and ear canal normal. A middle ear effusion is present.  Left Ear: Hearing and ear canal normal. A middle ear effusion is present.  Mouth/Throat: Uvula is midline, oropharynx is clear and moist and mucous membranes are normal. No oral lesions. No uvula swelling. No oropharyngeal exudate, posterior oropharyngeal edema, posterior oropharyngeal erythema or tonsillar abscesses. Tonsils are 0 on the right. Tonsils are 0 on the left. No tonsillar exudate.  Eyes: Pupils are equal, round, and reactive to light. EOM are normal.  Neck: Normal range of motion. Neck supple.  Cardiovascular: Normal rate, regular rhythm, normal heart sounds and intact distal pulses.  Neurological: She is alert and oriented to person, place, and time.  Skin: Skin is warm and dry.  Psychiatric: She has a normal mood and affect. Her behavior is normal.  Nursing note and vitals reviewed.    Cobble stoning posterior pharynx,  No erythema Clear discharge in nostrils swelling on the right side.     Assessment & Plan:  Viral illness 3-5 days  If not improving follow up with your doctor  Rest and fluids. Add in Tylenol if Advil is not working for pain , take as directed. If diarrhea occurs to use OTC Imodium take as directed. Declines work note. Return or follow  up with your doctor if worsening in 3-5 days. Patient verbalizes understanding and has no questions at discharge.

## 2017-11-22 NOTE — Patient Instructions (Signed)
Viral Illness, Adult Viruses are tiny germs that can get into a person's body and cause illness. There are many different types of viruses, and they cause many types of illness. Viral illnesses can range from mild to severe. They can affect various parts of the body. Common illnesses that are caused by a virus include colds and the flu. Viral illnesses also include serious conditions such as HIV/AIDS (human immunodeficiency virus/acquired immunodeficiency syndrome). A few viruses have been linked to certain cancers. What are the causes? Many types of viruses can cause illness. Viruses invade cells in your body, multiply, and cause the infected cells to malfunction or die. When the cell dies, it releases more of the virus. When this happens, you develop symptoms of the illness, and the virus continues to spread to other cells. If the virus takes over the function of the cell, it can cause the cell to divide and grow out of control, as is the case when a virus causes cancer. Different viruses get into the body in different ways. You can get a virus by:  Swallowing food or water that is contaminated with the virus.  Breathing in droplets that have been coughed or sneezed into the air by an infected person.  Touching a surface that has been contaminated with the virus and then touching your eyes, nose, or mouth.  Being bitten by an insect or animal that carries the virus.  Having sexual contact with a person who is infected with the virus.  Being exposed to blood or fluids that contain the virus, either through an open cut or during a transfusion.  If a virus enters your body, your body's defense system (immune system) will try to fight the virus. You may be at higher risk for a viral illness if your immune system is weak. What are the signs or symptoms? Symptoms vary depending on the type of virus and the location of the cells that it invades. Common symptoms of the main types of viral illnesses  include: Cold and flu viruses  Fever.  Headache.  Sore throat.  Muscle aches.  Nasal congestion.  Cough. Digestive system (gastrointestinal) viruses  Fever.  Abdominal pain.  Nausea.  Diarrhea. Liver viruses (hepatitis)  Loss of appetite.  Tiredness.  Yellowing of the skin (jaundice). Brain and spinal cord viruses  Fever.  Headache.  Stiff neck.  Nausea and vomiting.  Confusion or sleepiness. Skin viruses  Warts.  Itching.  Rash. Sexually transmitted viruses  Discharge.  Swelling.  Redness.  Rash. How is this treated? Viruses can be difficult to treat because they live within cells. Antibiotic medicines do not treat viruses because these drugs do not get inside cells. Treatment for a viral illness may include:  Resting and drinking plenty of fluids.  Medicines to relieve symptoms. These can include over-the-counter medicine for pain and fever, medicines for cough or congestion, and medicines to relieve diarrhea.  Antiviral medicines. These drugs are available only for certain types of viruses. They may help reduce flu symptoms if taken early. There are also many antiviral medicines for hepatitis and HIV/AIDS.  Some viral illnesses can be prevented with vaccinations. A common example is the flu shot. Follow these instructions at home: Medicines   Take over-the-counter and prescription medicines only as told by your health care provider.  If you were prescribed an antiviral medicine, take it as told by your health care provider. Do not stop taking the medicine even if you start to feel better.  Be aware  of when antibiotics are needed and when they are not needed. Antibiotics do not treat viruses. If your health care provider thinks that you may have a bacterial infection as well as a viral infection, you may get an antibiotic. ? Do not ask for an antibiotic prescription if you have been diagnosed with a viral illness. That will not make your  illness go away faster. ? Frequently taking antibiotics when they are not needed can lead to antibiotic resistance. When this develops, the medicine no longer works against the bacteria that it normally fights. General instructions  Drink enough fluids to keep your urine clear or pale yellow.  Rest as much as possible.  Return to your normal activities as told by your health care provider. Ask your health care provider what activities are safe for you.  Keep all follow-up visits as told by your health care provider. This is important. How is this prevented? Take these actions to reduce your risk of viral infection:  Eat a healthy diet and get enough rest.  Wash your hands often with soap and water. This is especially important when you are in public places. If soap and water are not available, use hand sanitizer.  Avoid close contact with friends and family who have a viral illness.  If you travel to areas where viral gastrointestinal infection is common, avoid drinking water or eating raw food.  Keep your immunizations up to date. Get a flu shot every year as told by your health care provider.  Do not share toothbrushes, nail clippers, razors, or needles with other people.  Always practice safe sex.  Contact a health care provider if:  You have symptoms of a viral illness that do not go away.  Your symptoms come back after going away.  Your symptoms get worse. Get help right away if:  You have trouble breathing.  You have a severe headache or a stiff neck.  You have severe vomiting or abdominal pain. This information is not intended to replace advice given to you by your health care provider. Make sure you discuss any questions you have with your health care provider. Document Released: 09/27/2015 Document Revised: 10/30/2015 Document Reviewed: 09/27/2015 Elsevier Interactive Patient Education  [employer].

## 2017-12-17 ENCOUNTER — Encounter: Payer: Self-pay | Admitting: Family Medicine

## 2017-12-17 ENCOUNTER — Ambulatory Visit (INDEPENDENT_AMBULATORY_CARE_PROVIDER_SITE_OTHER): Payer: BLUE CROSS/BLUE SHIELD | Admitting: Family Medicine

## 2017-12-17 VITALS — BP 120/90 | HR 81 | Temp 98.1°F | Resp 16 | Ht 62.5 in | Wt 160.0 lb

## 2017-12-17 DIAGNOSIS — E663 Overweight: Secondary | ICD-10-CM | POA: Insufficient documentation

## 2017-12-17 DIAGNOSIS — Z0000 Encounter for general adult medical examination without abnormal findings: Secondary | ICD-10-CM

## 2017-12-17 DIAGNOSIS — Z1231 Encounter for screening mammogram for malignant neoplasm of breast: Secondary | ICD-10-CM

## 2017-12-17 DIAGNOSIS — E559 Vitamin D deficiency, unspecified: Secondary | ICD-10-CM

## 2017-12-17 DIAGNOSIS — Z23 Encounter for immunization: Secondary | ICD-10-CM

## 2017-12-17 DIAGNOSIS — D649 Anemia, unspecified: Secondary | ICD-10-CM

## 2017-12-17 DIAGNOSIS — Z1239 Encounter for other screening for malignant neoplasm of breast: Secondary | ICD-10-CM

## 2017-12-17 DIAGNOSIS — Z13220 Encounter for screening for lipoid disorders: Secondary | ICD-10-CM | POA: Diagnosis not present

## 2017-12-17 NOTE — Addendum Note (Signed)
Addended by: Brennan Bailey on: 12/17/2017 11:06 AM   Modules accepted: Orders

## 2017-12-17 NOTE — Progress Notes (Signed)
Patient: Diane Curry, Female    DOB: February 25, 1976, 42 y.o.   MRN: [id number] Visit Date: 12/17/2017  Today's Provider: Lavon Paganini, MD   I, Martha Clan, CMA, am acting as scribe for Lavon Paganini, MD.  Chief Complaint  Patient presents with  . Annual Exam   Subjective:    Annual physical exam Diane Curry is a 42 y.o. female who presents today for health maintenance and complete physical. She feels well. She reports exercising sometimes, when she is not too busy with school. She reports she is sleeping well.  Last pap- 01/05/2012- NIL; HPV negative. Pt states she sees Administrator, sports; Will request records. Last mammogram- 11/27/2009- BI-RADS 2. F/B GYN. Will request records.  Believes she is due for a repeat mammogram -----------------------------------------------------------------   Review of Systems  Constitutional: Negative.   HENT: Negative.   Eyes: Negative.   Respiratory: Negative.   Cardiovascular: Negative.   Gastrointestinal: Negative.   Endocrine: Negative.   Genitourinary: Negative.   Musculoskeletal: Negative.   Skin: Negative.   Allergic/Immunologic: Positive for environmental allergies. Negative for food allergies and immunocompromised state.  Neurological: Positive for headaches. Negative for dizziness, tremors, seizures, syncope, facial asymmetry, speech difficulty, weakness, light-headedness and numbness.  Hematological: Negative.   Psychiatric/Behavioral: Negative.     Social History      She  reports that she has never smoked. She has never used smokeless tobacco. She reports that she drinks alcohol. She reports that she does not use drugs.       Social History   Socioeconomic History  . Marital status: Married    Spouse name: Diane Curry  . Number of children: 3  . Years of education: 36  . Highest education level: Not on file  Occupational History    Comment: [name]  . 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 Financial resource strain: Not on file  . Food insecurity:    Worry: Not on file    Inability: Not on file  . Transportation needs:    Medical: Not on file    Non-medical: Not on file  Tobacco Use  . Smoking status: Never Smoker  . Smokeless tobacco: Never Used  Substance and Sexual Activity  . Alcohol use: Yes    Comment: Once per month; wine or mixed drinks  . Drug use: No  . Sexual activity: Yes  Lifestyle  . Physical activity:    Days per week: Not on file    Minutes per session: Not on file  . Stress: Not on file  Relationships  . Social connections:    Talks on phone: Not on file    Gets together: Not on file    Attends religious service: Not on file    Active member of club or organization: Not on file    Attends meetings of clubs or organizations: Not on file    Relationship status: Not on file  Other Topics Concern  . Not on file  Social History Narrative  . Not on file    Past Medical History:  Diagnosis Date  . Anemia   . Menstrual periods irregular   . Vitamin D deficiency      Patient Active Problem List   Diagnosis Date Noted  . Overweight 12/17/2017  . Migraine 12/26/2016  . Absolute anemia 09/03/2015  . Avitaminosis D 07/30/2008  . Irregular bleeding 07/21/2008  . Allergic rhinitis  12/15/2001    Past Surgical History:  Procedure Laterality Date  . DILATION AND CURETTAGE OF UTERUS  2013   SAB at 10-12 weeks, warranted PRBC and admission overnight. [place], [place]  . harrington rod  1987  . PILONIDAL CYST EXCISION  2000   Dr.Byrnette  . TONSILLECTOMY  1992    Family History        Family Status  Relation Name Status  . Mother  Alive       anemia iron deficiency  . Father  Alive       BPH  . Sister 1 Alive       Cervical Dysplasia  . Brother 1 Alive  . Sister 2 Alive       Obese  . Sister 3 Alive  . Brother 2 Alive  . Brother 3 Alive  . Brother 4 Alive  . MGM  Deceased    . MGF  Deceased  . PGM  Deceased  . PGF  Deceased  . Neg Hx  (Not Specified)        Her family history includes Breast cancer (age of onset: 78) in her paternal grandmother; Diabetes in her father; Hypertension in her father, maternal grandfather, and paternal grandfather; Osteoporosis in her mother; Other in her sister; Stroke in her paternal grandfather. There is no history of Colon cancer, Ovarian cancer, or Cervical cancer.      No Known Allergies   Current Outpatient Medications:  .  acetaminophen (TYLENOL) 325 MG tablet, Take 650 mg by mouth every 6 (six) hours as needed., Disp: , Rfl:  .  ibuprofen (ADVIL,MOTRIN) 200 MG tablet, Take 200 mg by mouth every 6 (six) hours as needed., Disp: , Rfl:  .  loratadine (CLARITIN) 10 MG tablet, Take 10 mg by mouth daily., Disp: , Rfl:  .  Multiple Vitamins-Minerals (WOMENS MULTIVITAMIN PO), Take by mouth., Disp: , Rfl:  .  SUMAtriptan (IMITREX) 100 MG tablet, Take 1 tablet (100 mg total) by mouth once as needed. May repeat in 2 hours if headache persists or recurs., Disp: 10 tablet, Rfl: 12   Patient Care Team: Virginia Crews, MD as PCP - General (Family Medicine)      Objective:   Vitals: BP 120/90 (BP Location: Left Arm, Patient Position: Sitting, Cuff Size: Normal)   Pulse 81   Temp 98.1 F (36.7 C) (Oral)   Resp 16   Ht 5' 2.5" (1.588 m)   Wt 160 lb (72.6 kg)   LMP 12/09/2017   SpO2 98%   BMI 28.80 kg/m    Vitals:   12/17/17 0918  BP: 120/90  Pulse: 81  Resp: 16  Temp: 98.1 F (36.7 C)  TempSrc: Oral  SpO2: 98%  Weight: 160 lb (72.6 kg)  Height: 5' 2.5" (1.588 m)     Physical Exam  Constitutional: She is oriented to person, place, and time. She appears well-developed and well-nourished. No distress.  HENT:  Head: Normocephalic and atraumatic.  Right Ear: External ear normal.  Left Ear: External ear normal.  Nose: Nose normal.  Mouth/Throat: Oropharynx is clear and moist.  Eyes: Pupils are equal, round,  and reactive to light. Conjunctivae and EOM are normal. No scleral icterus.  Neck: Neck supple. No thyromegaly present.  Cardiovascular: Normal rate, regular Diane, normal heart sounds and intact distal pulses.  No murmur heard. Pulmonary/Chest: Effort normal and breath sounds normal. No respiratory distress. She has no wheezes. She has no rales.  Abdominal: Soft. Bowel sounds are normal. She  exhibits no distension. There is no tenderness. There is no rebound and no guarding.  Musculoskeletal: She exhibits no edema or deformity.  Lymphadenopathy:    She has no cervical adenopathy.  Neurological: She is alert and oriented to person, place, and time. No cranial nerve deficit or sensory deficit. She exhibits normal muscle tone.  Skin: Skin is warm and dry. Capillary refill takes less than 2 seconds. No rash noted.  Psychiatric: She has a normal mood and affect. Her behavior is normal.  Vitals reviewed.    Depression Screen PHQ 2/9 Scores 12/17/2017  PHQ - 2 Score 0     Assessment & Plan:     Routine Health Maintenance and Physical Exam  Exercise Activities and Dietary recommendations Goals    None      Immunization History  Administered Date(s) Administered  . Tdap 06/17/2006    Health Maintenance  Topic Date Due  . HIV Screening  02/10/1989  . PAP SMEAR  01/05/2015  . TETANUS/TDAP  06/17/2016  . INFLUENZA VACCINE  12/30/2017     Discussed health benefits of physical activity, and encouraged her to engage in regular exercise appropriate for her age and condition.    --------------------------------------------------------------------  Problem List Items Addressed This Visit      Other   Absolute anemia   Relevant Orders   CBC   Fe+TIBC+Fer   Avitaminosis D   Relevant Orders   VITAMIN D 25 Hydroxy (Vit-D Deficiency, Fractures)   Overweight   Relevant Orders   Comprehensive metabolic panel   Lipid panel    Other Visit Diagnoses    Encounter for annual  physical exam    -  Primary   Screening for lipid disorders       Relevant Orders   Comprehensive metabolic panel   Lipid panel   Screening for breast cancer       Relevant Orders   MM 3D SCREEN BREAST BILATERAL       Return in about 1 year (around 12/18/2018) for Physical.  Will monitor home BP and f/u if remains elevated.  Seems related to stress today   The entirety of the information documented in the History of Present Illness, Review of Systems and Physical Exam were personally obtained by me. Portions of this information were initially documented by Raquel Sarna Ratchford, CMA and reviewed by me for thoroughness and accuracy.    Virginia Crews, MD, MPH [facility] 12/17/2017 10:12 AM

## 2017-12-17 NOTE — Patient Instructions (Addendum)
Come back for fasting lab work after 12/24/17   Preventive Care 44-64 Years, Female Preventive care refers to lifestyle choices and visits with your health care provider that can promote health and wellness. What does preventive care include?  A yearly physical exam. This is also called an annual well check.  Dental exams once or twice a year.  Routine eye exams. Ask your health care provider how often you should have your eyes checked.  Personal lifestyle choices, including: ? Daily care of your teeth and gums. ? Regular physical activity. ? Eating a healthy diet. ? Avoiding tobacco and drug use. ? Limiting alcohol use. ? Practicing safe sex. ? Taking low-dose aspirin daily starting at age 44. ? Taking vitamin and mineral supplements as recommended by your health care provider. What happens during an annual well check? The services and screenings done by your health care provider during your annual well check will depend on your age, overall health, lifestyle risk factors, and family history of disease. Counseling Your health care provider may ask you questions about your:  Alcohol use.  Tobacco use.  Drug use.  Emotional well-being.  Home and relationship well-being.  Sexual activity.  Eating habits.  Work and work Statistician.  Method of birth control.  Menstrual cycle.  Pregnancy history.  Screening You may have the following tests or measurements:  Height, weight, and BMI.  Blood pressure.  Lipid and cholesterol levels. These may be checked every 5 years, or more frequently if you are over 65 years old.  Skin check.  Lung cancer screening. You may have this screening every year starting at age 44 if you have a 30-pack-year history of smoking and currently smoke or have quit within the past 15 years.  Fecal occult blood test (FOBT) of the stool. You may have this test every year starting at age 9.  Flexible sigmoidoscopy or colonoscopy. You may have  a sigmoidoscopy every 5 years or a colonoscopy every 10 years starting at age 44.  Hepatitis C blood test.  Hepatitis B blood test.  Sexually transmitted disease (STD) testing.  Diabetes screening. This is done by checking your blood sugar (glucose) after you have not eaten for a while (fasting). You may have this done every 1-3 years.  Mammogram. This may be done every 1-2 years. Talk to your health care provider about when you should start having regular mammograms. This may depend on whether you have a family history of breast cancer.  BRCA-related cancer screening. This may be done if you have a family history of breast, ovarian, tubal, or peritoneal cancers.  Pelvic exam and Pap test. This may be done every 3 years starting at age 44. Starting at age 44, this may be done every 5 years if you have a Pap test in combination with an HPV test.  Bone density scan. This is done to screen for osteoporosis. You may have this scan if you are at high risk for osteoporosis.  Discuss your test results, treatment options, and if necessary, the need for more tests with your health care provider. Vaccines Your health care provider may recommend certain vaccines, such as:  Influenza vaccine. This is recommended every year.  Tetanus, diphtheria, and acellular pertussis (Tdap, Td) vaccine. You may need a Td booster every 10 years.  Varicella vaccine. You may need this if you have not been vaccinated.  Zoster vaccine. You may need this after age 44.  Measles, mumps, and rubella (MMR) vaccine. You may need at  least one dose of MMR if you were born in 1957 or later. You may also need a second dose.  Pneumococcal 13-valent conjugate (PCV13) vaccine. You may need this if you have certain conditions and were not previously vaccinated.  Pneumococcal polysaccharide (PPSV23) vaccine. You may need one or two doses if you smoke cigarettes or if you have certain conditions.  Meningococcal vaccine. You may  need this if you have certain conditions.  Hepatitis A vaccine. You may need this if you have certain conditions or if you travel or work in places where you may be exposed to hepatitis A.  Hepatitis B vaccine. You may need this if you have certain conditions or if you travel or work in places where you may be exposed to hepatitis B.  Haemophilus influenzae type b (Hib) vaccine. You may need this if you have certain conditions.  Talk to your health care provider about which screenings and vaccines you need and how often you need them. This information is not intended to replace advice given to you by your health care provider. Make sure you discuss any questions you have with your health care provider. Document Released: 06/14/2015 Document Revised: 02/05/2016 Document Reviewed: 03/19/2015 Elsevier Interactive Patient Education  [employer].

## 2018-02-15 ENCOUNTER — Ambulatory Visit
Admission: RE | Admit: 2018-02-15 | Discharge: 2018-02-15 | Disposition: A | Discharge status: Home / Self Care | Payer: BLUE CROSS/BLUE SHIELD | Source: Ambulatory Visit | Attending: Family Medicine | Admitting: Family Medicine

## 2018-02-15 DIAGNOSIS — Z1239 Encounter for other screening for malignant neoplasm of breast: Secondary | ICD-10-CM

## 2018-02-15 DIAGNOSIS — Z1231 Encounter for screening mammogram for malignant neoplasm of breast: Secondary | ICD-10-CM | POA: Diagnosis not present

## 2018-02-21 ENCOUNTER — Inpatient Hospital Stay
Admission: RE | Admit: 2018-02-21 | Discharge: 2018-02-21 | Disposition: A | Discharge status: Home / Self Care | Payer: Self-pay | Source: Ambulatory Visit | Attending: *Deleted | Admitting: *Deleted

## 2018-02-21 ENCOUNTER — Other Ambulatory Visit: Payer: Self-pay | Admitting: Family Medicine

## 2018-02-21 ENCOUNTER — Other Ambulatory Visit: Payer: Self-pay | Admitting: *Deleted

## 2018-02-21 DIAGNOSIS — R921 Mammographic calcification found on diagnostic imaging of breast: Secondary | ICD-10-CM

## 2018-02-21 DIAGNOSIS — Z9289 Personal history of other medical treatment: Secondary | ICD-10-CM

## 2018-02-21 DIAGNOSIS — R928 Other abnormal and inconclusive findings on diagnostic imaging of breast: Secondary | ICD-10-CM

## 2018-02-25 ENCOUNTER — Ambulatory Visit
Admission: RE | Admit: 2018-02-25 | Discharge: 2018-02-25 | Disposition: A | Discharge status: Home / Self Care | Payer: BLUE CROSS/BLUE SHIELD | Source: Ambulatory Visit | Attending: Family Medicine | Admitting: Family Medicine

## 2018-02-25 DIAGNOSIS — R928 Other abnormal and inconclusive findings on diagnostic imaging of breast: Secondary | ICD-10-CM

## 2018-02-25 DIAGNOSIS — R921 Mammographic calcification found on diagnostic imaging of breast: Secondary | ICD-10-CM | POA: Insufficient documentation

## 2018-05-10 ENCOUNTER — Other Ambulatory Visit: Payer: Self-pay | Admitting: Neurology

## 2018-05-10 DIAGNOSIS — D508 Other iron deficiency anemias: Secondary | ICD-10-CM

## 2018-06-08 ENCOUNTER — Telehealth: Payer: Self-pay | Admitting: Neurology

## 2018-06-08 DIAGNOSIS — D508 Other iron deficiency anemias: Secondary | ICD-10-CM

## 2018-06-08 NOTE — Telephone Encounter (Signed)
Pt has scheduled her annual f/u and is asking if Dr Jaynee Eagles can have enough of the medication SUMAtriptan (IMITREX) 100 MG tablet  [id number] into pharmacy until her upcoming appointment

## 2018-06-09 MED ORDER — SUMATRIPTAN SUCCINATE 100 MG PO TABS: 100.0000 mg | ORAL_TABLET | Freq: Once | ORAL | 12 refills | Status: DC | PRN

## 2018-06-09 NOTE — Addendum Note (Signed)
Addended by: Thomes Cake on: 06/09/2018 08:12 AM   Modules accepted: Orders

## 2018-08-04 ENCOUNTER — Other Ambulatory Visit: Payer: Self-pay | Admitting: Family Medicine

## 2018-08-04 DIAGNOSIS — R921 Mammographic calcification found on diagnostic imaging of breast: Secondary | ICD-10-CM

## 2018-08-04 DIAGNOSIS — R928 Other abnormal and inconclusive findings on diagnostic imaging of breast: Secondary | ICD-10-CM

## 2018-08-24 ENCOUNTER — Ambulatory Visit: Payer: BLUE CROSS/BLUE SHIELD | Admitting: Neurology

## 2018-09-06 ENCOUNTER — Other Ambulatory Visit: Payer: Self-pay | Admitting: Family Medicine

## 2018-09-06 ENCOUNTER — Ambulatory Visit
Admission: RE | Admit: 2018-09-06 | Discharge: 2018-09-06 | Disposition: A | Discharge status: Home / Self Care | Payer: BLUE CROSS/BLUE SHIELD | Source: Ambulatory Visit | Attending: Family Medicine | Admitting: Family Medicine

## 2018-09-06 ENCOUNTER — Other Ambulatory Visit: Payer: Self-pay

## 2018-09-06 DIAGNOSIS — R921 Mammographic calcification found on diagnostic imaging of breast: Secondary | ICD-10-CM

## 2018-09-06 DIAGNOSIS — R928 Other abnormal and inconclusive findings on diagnostic imaging of breast: Secondary | ICD-10-CM | POA: Diagnosis not present

## 2018-09-06 DIAGNOSIS — Z1231 Encounter for screening mammogram for malignant neoplasm of breast: Secondary | ICD-10-CM

## 2018-11-02 ENCOUNTER — Telehealth: Payer: Self-pay

## 2018-11-02 NOTE — Telephone Encounter (Signed)
Unable to get in contact with the patient to convert her office visit into a doxy.me visit. I was unable to leave a voicemail due her to voicemail being full. I will attempt to call back later.   If patient calls back please convert her appt into a doxy.me visit with Amy.

## 2018-11-08 NOTE — Telephone Encounter (Signed)
Spoke with the patient and she stated that she received a link for her doxy.me appt with Amy from [facility], [place]. Patient has given verbal consent to do a doxy.me visit and to file her insurance.

## 2018-11-09 ENCOUNTER — Other Ambulatory Visit: Payer: Self-pay

## 2018-11-09 ENCOUNTER — Encounter: Payer: Self-pay | Admitting: Family Medicine

## 2018-11-09 ENCOUNTER — Ambulatory Visit (INDEPENDENT_AMBULATORY_CARE_PROVIDER_SITE_OTHER): Payer: BC Managed Care – PPO | Admitting: Family Medicine

## 2018-11-09 ENCOUNTER — Encounter

## 2018-11-09 DIAGNOSIS — G43009 Migraine without aura, not intractable, without status migrainosus: Secondary | ICD-10-CM

## 2018-11-09 NOTE — Progress Notes (Signed)
   PATIENT: Diane Curry DOB: 08/21/1975  REASON FOR VISIT: follow up HISTORY FROM: patient  Virtual Visit via Telephone Note  I connected with Silvina A Lewers on 11/09/18 at  9:30 AM EDT by telephone and verified that I am speaking with the correct person using two identifiers.   I discussed the limitations, risks, security and privacy concerns of performing an evaluation and management service by telephone and the availability of in person appointments. I also discussed with the patient that there may be a patient responsible charge related to this service. The patient expressed understanding and agreed to proceed.   History of Present Illness:  11/09/18 Francile Woolford Whitford is a 43 y.o. female here today for follow up for migraines.  She reports that she is doing very well on acute management with sumatriptan.  She has about 1 migraine per month.  She is tolerating medication well with no obvious adverse effects.  She denies any new symptoms or concerns today.  History (copied from Dr Cathren Laine note on 12/24/2016)  HPI:  Savilla Turbyfill Escalera is a 43 y.o. female here as a referral from Dr. Terrilee Croak for headaches. 12-18 months ago headaches started worsening, more frequent and intense. Sister with migraines. Worse with barometric presure and hormones like at the start of her period. She had headaches for a whole week during her period. She has tried tylenol, alleve. Caffeine helped. Headaches start at the base of her neck, worse on the left side of her head, pounding through her temples, behind the eye, she has nausea occ, no aura, she endorses light sensitivity, sound sensitivity, moving makes it pound more. Can last 4 hours to days. Rest helps but she may wake up with the headache. Over the last 4 months she has about 7 days a month of migraines. No other focal neurologic deficits, associated symptoms, inciting events or modifiable factors. No vision changes, no fever, no weakness.    Observations/Objective:  Generalized: Well developed, in no acute distress  Mentation: Alert oriented to time, place, history taking. Follows all commands speech and language fluent   Assessment and Plan:  43 y.o. year old female  has a past medical history of Anemia, Menstrual periods irregular, and Vitamin D deficiency. here with    ICD-10-CM   1. Migraine without aura and without status migrainosus, not intractable G43.009    She is doing very well on acute management with sumatriptan as needed.  She is tolerating therapy well.  We will continue acute management for now.  She was advised to call with any worsening of symptoms.  She verbalizes agreement and understanding with this plan.  No orders of the defined types were placed in this encounter.   No orders of the defined types were placed in this encounter.    Follow Up Instructions:  I discussed the assessment and treatment plan with the patient. The patient was provided an opportunity to ask questions and all were answered. The patient agreed with the plan and demonstrated an understanding of the instructions.   The patient was advised to call back or seek an in-person evaluation if the symptoms worsen or if the condition fails to improve as anticipated.  I provided 15 minutes of non-face-to-face time during this encounter.  Patient is located at her place of residence during video conference.  Provider is located at her place of residence.  Maryelizabeth Kaufmann, CMA helped to facilitate visit.   Debbora Presto, NP

## 2018-11-11 NOTE — Progress Notes (Signed)
Made any corrections needed, and agree with history, physical, neuro exam,assessment and plan as stated.     Sarina Ill, MD [facility]

## 2018-12-10 ENCOUNTER — Emergency Department (HOSPITAL_COMMUNITY)
Admission: EM | Admit: 2018-12-10 | Discharge: 2018-12-10 | Disposition: A | Discharge status: Home / Self Care | Payer: BC Managed Care – PPO | Attending: Emergency Medicine | Admitting: Emergency Medicine

## 2018-12-10 ENCOUNTER — Emergency Department (HOSPITAL_COMMUNITY): Payer: BC Managed Care – PPO

## 2018-12-10 ENCOUNTER — Other Ambulatory Visit: Payer: Self-pay

## 2018-12-10 ENCOUNTER — Encounter (HOSPITAL_COMMUNITY): Payer: Self-pay

## 2018-12-10 DIAGNOSIS — Z79899 Other long term (current) drug therapy: Secondary | ICD-10-CM | POA: Diagnosis not present

## 2018-12-10 DIAGNOSIS — R079 Chest pain, unspecified: Secondary | ICD-10-CM

## 2018-12-10 LAB — TROPONIN I (HIGH SENSITIVITY)
Troponin I (High Sensitivity): <2 ng/L (ref <18)
Troponin I (High Sensitivity): <2 ng/L (ref <18)

## 2018-12-10 LAB — BASIC METABOLIC PANEL
Anion gap: 8 (ref 5–15)
BUN: 7 mg/dL (ref 6–20)
CO2: 24 mmol/L (ref 22–32)
Calcium: 9.3 mg/dL (ref 8.9–10.3)
Chloride: 107 mmol/L (ref 98–111)
Creatinine, Ser: 0.71 mg/dL (ref 0.44–1.00)
GFR calc Af Amer: >60 mL/min (ref >60)
GFR calc non Af Amer: >60 mL/min (ref >60)
Glucose, Bld: 101 mg/dL — ABNORMAL HIGH (ref 70–99)
Potassium: 3.7 mmol/L (ref 3.5–5.1)
Sodium: 139 mmol/L (ref 135–145)

## 2018-12-10 LAB — CBC
HCT: 33.7 % — ABNORMAL LOW (ref 36.0–46.0)
Hemoglobin: 10.6 g/dL — ABNORMAL LOW (ref 12.0–15.0)
MCH: 25.3 pg — ABNORMAL LOW (ref 26.0–34.0)
MCHC: 31.5 g/dL (ref 30.0–36.0)
MCV: 80.4 fL (ref 80.0–100.0)
Platelets: 416 10*3/uL — ABNORMAL HIGH (ref 150–400)
RBC: 4.19 MIL/uL (ref 3.87–5.11)
RDW: 15.9 % — ABNORMAL HIGH (ref 11.5–15.5)
WBC: 7.2 10*3/uL (ref 4.0–10.5)
nRBC: 0 % (ref 0.0–0.2)

## 2018-12-10 LAB — HEPATIC FUNCTION PANEL
ALT: 14 U/L (ref 0–44)
AST: 15 U/L (ref 15–41)
Albumin: 3.9 g/dL (ref 3.5–5.0)
Alkaline Phosphatase: 55 U/L (ref 38–126)
Bilirubin, Direct: <0.1 mg/dL (ref 0.0–0.2)
Total Bilirubin: 0.5 mg/dL (ref 0.3–1.2)
Total Protein: 6.8 g/dL (ref 6.5–8.1)

## 2018-12-10 LAB — LIPASE, BLOOD: Lipase: 34 U/L (ref 11–51)

## 2018-12-10 LAB — I-STAT BETA HCG BLOOD, ED (MC, WL, AP ONLY): I-stat hCG, quantitative: <5 m[IU]/mL (ref <5)

## 2018-12-10 NOTE — ED Provider Notes (Signed)
[facility] EMERGENCY DEPARTMENT Provider Note   CSN: [id number] Arrival date & time: 12/10/18  1437    History   Chief Complaint Chief Complaint  Patient presents with  . Chest Pain    HPI Diane Curry is a 43 y.o. female with past history of anemia presents emergency department today with chief complaint of chest pain.  Onset was acute happening just prior to arrival.  Patient states she was brushing her hair when she started to have centralized chest pain.  She describes the pain as sharp.  She reports associated episodes of diaphoresis.  The pain has been constant and she rates it 4 out of 10 in severity.  She tried drinking a glass of milk and taking omeprazole without symptom relief.  About an hour after the pain started she noticed that it was radiating through to her back.  Patient without risk factors for cardiac disease.  She states she has had heartburn before but this felt different.  She denies fever, chills, cough, abdominal pain, nausea, vomiting, urinary symptoms, diarrhea.    Past Medical History:  Diagnosis Date  . Anemia   . Menstrual periods irregular   . Vitamin D deficiency     Patient Active Problem List   Diagnosis Date Noted  . Overweight 12/17/2017  . Migraine 12/26/2016  . Absolute anemia 09/03/2015  . Avitaminosis D 07/30/2008  . Irregular bleeding 07/21/2008  . Allergic rhinitis 12/15/2001    Past Surgical History:  Procedure Laterality Date  . DILATION AND CURETTAGE OF UTERUS  2013   SAB at 10-12 weeks, warranted PRBC and admission overnight. [place], [place]  . harrington rod  1987  . PILONIDAL CYST EXCISION  2000   Dr.Byrnette  . TONSILLECTOMY  1992     OB History   No obstetric history on file.      Home Medications    Prior to Admission medications   Medication Sig Start Date End Date Taking? Authorizing Provider  acetaminophen (TYLENOL) 325 MG tablet Take 650 mg by mouth every 6 (six) hours as needed.     [provider]  ibuprofen (ADVIL,MOTRIN) 200 MG tablet Take 200 mg by mouth every 6 (six) hours as needed.    [provider]  loratadine (CLARITIN) 10 MG tablet Take 10 mg by mouth daily.    [provider]  Multiple Vitamins-Minerals (WOMENS MULTIVITAMIN PO) Take by mouth.    [provider]  SUMAtriptan (IMITREX) 100 MG tablet Take 1 tablet (100 mg total) by mouth once as needed for up to 1 dose. May repeat in 2 hours if headache persists or recurs. 06/09/18   Melvenia Beam, MD    Family History Family History  Problem Relation Age of Onset  . Osteoporosis Mother   . Diabetes Father        non- insulin Dependent  . Hypertension Father   . Other Sister        produces "extra CSF" that causes visual abnormalities  . Hypertension Maternal Grandfather   . Breast cancer Paternal Grandmother 48  . Stroke Paternal Grandfather   . Hypertension Paternal Grandfather   . Colon cancer Neg Hx   . Ovarian cancer Neg Hx   . Cervical cancer Neg Hx     Social History Social History   Tobacco Use  . Smoking status: Never Smoker  . Smokeless tobacco: Never Used  Substance Use Topics  . Alcohol use: Yes    Comment: Once per month; wine  or mixed drinks  . Drug use: No     Allergies   Patient has no known allergies.   Review of Systems Review of Systems  Constitutional: Positive for diaphoresis. Negative for chills and fever.  HENT: Negative for congestion, ear discharge, ear pain, sinus pressure, sinus pain and sore throat.   Eyes: Negative for pain and redness.  Respiratory: Negative for cough and shortness of breath.   Cardiovascular: Positive for chest pain.  Gastrointestinal: Negative for abdominal pain, constipation, diarrhea, nausea and vomiting.  Genitourinary: Negative for dysuria and hematuria.  Musculoskeletal: Negative for back pain and neck pain.  Skin: Negative for wound.  Neurological: Negative for weakness, numbness and  headaches.     Physical Exam Updated Vital Signs BP 126/87 (BP Location: Right Arm)   Pulse 63   Temp 98.4 F (36.9 C) (Oral)   Resp 13   Ht 5' 3"  (1.6 m)   Wt 65 kg   SpO2 99%   BMI 25.38 kg/m   Physical Exam Vitals signs and nursing note reviewed.  Constitutional:      General: She is not in acute distress.    Appearance: She is not ill-appearing.  HENT:     Head: Normocephalic and atraumatic.     Right Ear: Tympanic membrane and external ear normal.     Left Ear: Tympanic membrane and external ear normal.     Nose: Nose normal.     Mouth/Throat:     Mouth: Mucous membranes are moist.     Pharynx: Oropharynx is clear.  Eyes:     General: No scleral icterus.       Right eye: No discharge.        Left eye: No discharge.     Extraocular Movements: Extraocular movements intact.     Conjunctiva/sclera: Conjunctivae normal.     Pupils: Pupils are equal, round, and reactive to light.  Neck:     Musculoskeletal: Normal range of motion.     Vascular: No JVD.  Cardiovascular:     Rate and Rhythm: Normal rate and regular rhythm.     Pulses: Normal pulses.          Radial pulses are 2+ on the right side and 2+ on the left side.     Heart sounds: Normal heart sounds.  Pulmonary:     Comments: Lungs clear to auscultation in all fields. Symmetric chest rise. No wheezing, rales, or rhonchi. Chest:     Chest wall: No tenderness.  Abdominal:     Comments: Abdomen is soft, non-distended, and non-tender in all quadrants. No rigidity, no guarding. No peritoneal signs.  Musculoskeletal: Normal range of motion.     Right lower leg: No edema.     Left lower leg: No edema.  Skin:    General: Skin is warm and dry.     Capillary Refill: Capillary refill takes less than 2 seconds.  Neurological:     Mental Status: She is oriented to person, place, and time.     GCS: GCS eye subscore is 4. GCS verbal subscore is 5. GCS motor subscore is 6.     Comments: Fluent speech, no facial  droop.  Psychiatric:        Behavior: Behavior normal.      ED Treatments / Results  Labs (all labs ordered are listed, but only abnormal results are displayed) Labs Reviewed  BASIC METABOLIC PANEL - Abnormal; Notable for the following components:      Result Value  Glucose, Bld 101 (*)    All other components within normal limits  CBC - Abnormal; Notable for the following components:   Hemoglobin 10.6 (*)    HCT 33.7 (*)    MCH 25.3 (*)    RDW 15.9 (*)    Platelets 416 (*)    All other components within normal limits  LIPASE, BLOOD  HEPATIC FUNCTION PANEL  I-STAT BETA HCG BLOOD, ED ([facility], [facility], [facility] ONLY)  TROPONIN I (HIGH SENSITIVITY)  TROPONIN I (HIGH SENSITIVITY)    EKG EKG Interpretation  Date/Time:  Saturday December 10 2018 14:49:33 EDT Ventricular Rate:  75 PR Interval:  134 QRS Duration: 74 QT Interval:  390 QTC Calculation: 435 R Axis:   0 Text Interpretation:  Normal sinus rhythm Low voltage QRS Cannot rule out Anterior infarct , age undetermined Abnormal ECG No previous ECGs available Confirmed by Wandra Arthurs ([id number]) on 12/10/2018 3:30:45 PM   Radiology Dg Chest 2 View  Result Date: 12/10/2018 CLINICAL DATA:  Centralized chest pain EXAM: CHEST - 2 VIEW COMPARISON:  None. FINDINGS: The heart size and mediastinal contours are within normal limits. Both lungs are clear. The visualized skeletal structures are unremarkable. IMPRESSION: No active cardiopulmonary disease. Electronically Signed   By: Dorise Bullion III M.D   On: 12/10/2018 16:34    Procedures Procedures (including critical care time)  Medications Ordered in ED Medications - No data to display   Initial Impression / Assessment and Plan / ED Course  I have reviewed the triage vital signs and the nursing notes.  Pertinent labs & imaging results that were available during my care of the patient were reviewed by me and considered in my medical decision making (see chart for details).  Patient  presents to the emergency department with chest pain. Patient nontoxic appearing, in no apparent distress, vitals without significant abnormality. Fairly benign physical exam. DDX: ACS, pulmonary embolism, dissection, pneumothorax, effusion, infiltrate, arrhythmia, anemia, electrolyte derangement, MSK. Evaluation initiated with labs, EKG, and CXR. Patient on cardiac monitor.   Work-up in the ER unremarkable. Labs reviewed, no leukocytosis, anemia, or significant electrolyte abnormality. CXR without infiltrate, effusion, pneumothorax, or fracture/dislocation.  Patient declines pain medication including ibuprofen while in the emergency department.  Low risk heart score of 1, EKG viewed by me without obvious ischemia, delta troponin negative, doubt ACS. Patient is low risk wells, PERC negative, doubt pulmonary embolism. Pain is not a tearing sensation, symmetric pulses, no widening of mediastinum on CXR, doubt dissection. Cardiac monitor reviewed, no notable arrhythmias or tachycardia. Patient has appeared hemodynamically stable throughout ER visit and appears safe for discharge with close PCP/cardiology follow up. I discussed results, treatment plan, need for PCP follow-up, and return precautions with the patient. Provided opportunity for questions, patient confirmed understanding and is in agreement with plan.    Case has been discussed with Dr. Darl Householder who agrees with the above plan to discharge.    Final Clinical Impressions(s) / ED Diagnoses   Final diagnoses:  Nonspecific chest pain    ED Discharge Orders    None       Flint Melter 12/10/18 1857    Drenda Freeze, MD 12/10/18 2321

## 2018-12-10 NOTE — Discharge Instructions (Addendum)
Read instructions below for reasons to return to the Emergency Department. It is recommended that your follow up with your Primary Care Doctor in regards to today's visit.   Tests performed today include: An EKG of your heart A chest x-ray Cardiac enzymes - a blood test for heart muscle damage Blood counts and electrolytes  Chest Pain (Nonspecific)  HOME CARE INSTRUCTIONS  -For the next few days, avoid physical activities that bring on chest pain. Continue physical activities as directed. .  -Follow your caregiver's suggestions for further testing if your chest pain does not go away.  -Take ibuprofen as directed on the bottle for pain.  Possible cause of your chest pain is musculoskeletal and taking anti-inflammatory will help.  SEEK MEDICAL CARE IF:  You think you are having problems from the medicine you are taking. Read your medicine instructions carefully.  Your chest pain does not go away, even after treatment.  You develop a rash with blisters on your chest.   SEEK IMMEDIATE MEDICAL CARE IF:  You have increased chest pain or pain that spreads to your arm, neck, jaw, back, or belly (abdomen).  You develop shortness of breath, an increasing cough, or you are coughing up blood.  You have severe back or abdominal pain, feel sick to your stomach (nauseous) or throw up (vomit).  You develop severe weakness, fainting, or chills.  You have an oral temperature above 102 F (38.9 C), not controlled by medicine.   THIS IS AN EMERGENCY. Do not wait to see if the pain will go away. Get medical help at once. Call 911. Do not drive yourself to the hospital.

## 2018-12-10 NOTE — ED Triage Notes (Signed)
Pt arrives POV from home for eval of centralized chest pain w/ radiation to back and some burning in R ear. Pt reports it started as she was getting out of the shower. Pt denies SOB, endorses some int. Clamminess. Pt in NAD, resps e/u in triage. Denies hx of same,

## 2018-12-20 ENCOUNTER — Encounter: Payer: Self-pay | Admitting: Family Medicine

## 2018-12-29 ENCOUNTER — Ambulatory Visit: Payer: BC Managed Care – PPO | Admitting: Physician Assistant

## 2018-12-29 ENCOUNTER — Other Ambulatory Visit: Payer: Self-pay

## 2018-12-29 ENCOUNTER — Encounter: Payer: Self-pay | Admitting: Physician Assistant

## 2018-12-29 VITALS — BP 116/74 | HR 91 | Temp 98.4°F | Resp 16 | Wt 141.0 lb

## 2018-12-29 DIAGNOSIS — R0789 Other chest pain: Secondary | ICD-10-CM | POA: Diagnosis not present

## 2018-12-29 DIAGNOSIS — M4105 Infantile idiopathic scoliosis, thoracolumbar region: Secondary | ICD-10-CM

## 2018-12-29 NOTE — Progress Notes (Signed)
Patient: Diane Curry Female    DOB: 03-Apr-1976   43 y.o.   MRN: [id number] Visit Date: 12/29/2018  Today's Provider: Mar Daring, PA-C   Chief Complaint  Patient presents with  . Follow-up   Subjective:     HPI  Follow up ER visit  Patient was seen in ER for chest pain on 12/10/2018. She was treated for nonspecific chest pain. Treatment for this included labs and EKG. She reports excellent compliance with treatment. She reports this condition is Improved. ------------------------------------------------------------------------------------   No Known Allergies   Current Outpatient Medications:  .  acetaminophen (TYLENOL) 325 MG tablet, Take 650 mg by mouth every 6 (six) hours as needed., Disp: , Rfl:  .  ibuprofen (ADVIL,MOTRIN) 200 MG tablet, Take 200 mg by mouth every 6 (six) hours as needed., Disp: , Rfl:  .  loratadine (CLARITIN) 10 MG tablet, Take 10 mg by mouth daily., Disp: , Rfl:  .  Multiple Vitamins-Minerals (WOMENS MULTIVITAMIN PO), Take by mouth., Disp: , Rfl:  .  SUMAtriptan (IMITREX) 100 MG tablet, Take 1 tablet (100 mg total) by mouth once as needed for up to 1 dose. May repeat in 2 hours if headache persists or recurs., Disp: 10 tablet, Rfl: 12  Review of Systems  Constitutional: Negative.   Respiratory: Negative.   Cardiovascular: Negative.   Musculoskeletal: Positive for back pain.    Social History   Tobacco Use  . Smoking status: Never Smoker  . Smokeless tobacco: Never Used  Substance Use Topics  . Alcohol use: Yes    Comment: Once per month; wine or mixed drinks      Objective:   BP 116/74 (BP Location: Left Arm, Patient Position: Sitting, Cuff Size: Normal)   Pulse 91   Temp 98.4 F (36.9 C) (Oral)   Resp 16   Wt 141 lb (64 kg)   BMI 24.98 kg/m  Vitals:   12/29/18 1511  BP: 116/74  Pulse: 91  Resp: 16  Temp: 98.4 F (36.9 C)  TempSrc: Oral  Weight: 141 lb (64 kg)     Physical Exam Vitals signs  reviewed.  Constitutional:      General: She is not in acute distress.    Appearance: Normal appearance. She is well-developed and normal weight. She is not ill-appearing or diaphoretic.  Neck:     Musculoskeletal: Normal range of motion and neck supple.     Thyroid: No thyromegaly.     Vascular: No JVD.     Trachea: No tracheal deviation.  Cardiovascular:     Rate and Rhythm: Normal rate and regular rhythm.     Heart sounds: Normal heart sounds. No murmur. No friction rub. No gallop.   Pulmonary:     Effort: Pulmonary effort is normal. No respiratory distress.     Breath sounds: Normal breath sounds. No wheezing or rales.  Lymphadenopathy:     Cervical: No cervical adenopathy.  Neurological:     Mental Status: She is alert.     No results found for any visits on 12/29/18.     Assessment & Plan    1. Atypical chest pain Improved. Possibly from back pain. Severe scoliosis with Harrington rod repair. Patient searching for provider for possible evaluation. She is going to contact [place] [facility] to see if they will help. If not may consider referral to Spine and Scoliosis Specialist in [place].   2. Infantile idiopathic scoliosis of thoracolumbar region See above medical treatment plan.  Mar Daring, PA-C  [facility]

## 2018-12-30 ENCOUNTER — Encounter: Payer: Self-pay | Admitting: Physician Assistant

## 2018-12-30 DIAGNOSIS — M4105 Infantile idiopathic scoliosis, thoracolumbar region: Secondary | ICD-10-CM | POA: Insufficient documentation

## 2019-02-07 NOTE — Progress Notes (Signed)
Patient: Diane Curry, Female    DOB: December 13, 1975, 43 y.o.   MRN: [id number] Visit Date: 02/08/2019  Today's Provider: Mar Daring, PA-C   Chief Complaint  Patient presents with   Annual Exam   Subjective:    I,[name],RMA am acting as a Education administrator for [employer], PA-C.   Annual physical exam Diane Curry is a 43 y.o. female who presents today for health maintenance and complete physical. She feels well. She reports exercising 3-4 times a week. She reports she is sleeping well. -----------------------------------------------------------------   Review of Systems  Constitutional: Negative.   HENT: Negative.   Eyes: Negative.   Respiratory: Negative.   Cardiovascular: Negative.   Gastrointestinal: Negative.   Endocrine: Negative.   Genitourinary: Negative.   Musculoskeletal: Negative.   Skin: Negative.   Allergic/Immunologic: Negative.   Neurological: Negative.   Hematological: Negative.   Psychiatric/Behavioral: Negative.     Social History      She  reports that she has never smoked. She has never used smokeless tobacco. She reports current alcohol use. She reports that she does not use drugs.       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  Social Needs   Financial resource strain: Not on file   Food insecurity    Worry: Not on file    Inability: Not on file   Transportation needs    Medical: Not on file    Non-medical: Not on file  Tobacco Use   Smoking status: Never Smoker   Smokeless tobacco: Never Used  Substance and Sexual Activity   Alcohol use: Yes    Comment: Once per month; wine or mixed drinks   Drug use: No   Sexual activity:  Yes  Lifestyle   Physical activity    Days per week: Not on file    Minutes per session: Not on file   Stress: Not on file  Relationships   Social connections    Talks on phone: Not on file    Gets together: Not on file    Attends religious service: Not on file    Active member of club or organization: Not on file    Attends meetings of clubs or organizations: Not on file    Relationship status: Not on file  Other Topics Concern   Not on file  Social History Narrative   Not on file    Past Medical History:  Diagnosis Date   Anemia    Menstrual periods irregular    Vitamin D deficiency      Patient Active Problem List   Diagnosis Date Noted   Infantile idiopathic scoliosis of thoracolumbar region 12/30/2018   Overweight 12/17/2017   Migraine 12/26/2016   Absolute anemia 09/03/2015   Avitaminosis D 07/30/2008   Irregular bleeding 07/21/2008   Allergic rhinitis 12/15/2001    Past Surgical History:  Procedure Laterality Date   DILATION AND CURETTAGE OF UTERUS  2013   SAB at 10-12 weeks, warranted PRBC and admission overnight. [place], [place]   har[facility] rod  [place]   Dr.Byrnette   TONSILLECTOMY  67    Family History        Family Status  Relation Name Status   Mother  Alive       anemia iron deficiency   Father  Alive       BPH   Sister 1 Alive       Cervical Dysplasia   Brother 92 Alive   Sister 2 Alive       Obese   Sister 60 Alive   Brother 2 Alive   Brother 68 Alive   Brother 34 Alive   MGM  Deceased   MGF  Deceased   PGM  Deceased   PGF  Deceased   Neg Hx  (Not Specified)        Her family history includes Breast cancer (age of onset: 60) in her paternal grandmother; Diabetes in her father; Hypertension in her father, maternal grandfather, and paternal grandfather; Osteoporosis in her mother; Other in her sister; Stroke in her paternal grandfather. There is no history of Colon cancer,  Ovarian cancer, or Cervical cancer.      No Known Allergies   Current Outpatient Medications:    acetaminophen (TYLENOL) 325 MG tablet, Take 650 mg by mouth every 6 (six) hours as needed., Disp: , Rfl:    FERROUS GLUCONATE PO, Take by mouth., Disp: , Rfl:    ibuprofen (ADVIL,MOTRIN) 200 MG tablet, Take 200 mg by mouth every 6 (six) hours as needed., Disp: , Rfl:    loratadine (CLARITIN) 10 MG tablet, Take 10 mg by mouth daily., Disp: , Rfl:    Multiple Vitamins-Minerals (WOMENS MULTIVITAMIN PO), Take by mouth., Disp: , Rfl:    SUMAtriptan (IMITREX) 100 MG tablet, Take 1 tablet (100 mg total) by mouth once as needed for up to 1 dose. May repeat in 2 hours if headache persists or recurs., Disp: 10 tablet, Rfl: 12   Patient Care Team: Virginia Crews, MD as PCP - General (Family Medicine)    Objective:    Vitals: BP 133/83 (BP Location: Left Arm, Patient Position: Sitting, Cuff Size: Normal)    Pulse 73    Temp (!) 97.5 F (36.4 C) (Other (Comment)) Comment (Src): forehead   Resp 16    Ht 5' 3"  (1.6 m)    Wt 143 lb 9.6 oz (65.1 kg)    BMI 25.44 kg/m    Vitals:   02/08/19 0918  BP: 133/83  Pulse: 73  Resp: 16  Temp: (!) 97.5 F (36.4 C)  TempSrc: Other (Comment)  Weight: 143 lb 9.6 oz (65.1 kg)  Height: 5' 3"  (1.6 m)     Physical Exam Vitals signs reviewed.  Constitutional:      General: She is not in acute distress.    Appearance: Normal appearance. She is well-developed. She is not diaphoretic.  HENT:     Head: Normocephalic and atraumatic.     Right Ear: Tympanic membrane, ear canal and external ear normal.     Left Ear: Tympanic membrane, ear canal and external ear normal.     Nose: Nose normal.     Mouth/Throat:     Mouth: Mucous membranes are moist.     Pharynx: Oropharynx is clear. No oropharyngeal exudate.  Eyes:     General: No scleral icterus.       Right eye: No discharge.        Left eye: No discharge.     Extraocular Movements: Extraocular  movements intact.     Conjunctiva/sclera: Conjunctivae normal.  Pupils: Pupils are equal, round, and reactive to light.  Neck:     Musculoskeletal: Normal range of motion and neck supple.     Thyroid: No thyromegaly.     Vascular: No JVD.     Trachea: No tracheal deviation.  Cardiovascular:     Rate and Rhythm: Normal rate and regular rhythm.     Pulses: Normal pulses.     Heart sounds: Normal heart sounds. No murmur. No friction rub. No gallop.   Pulmonary:     Effort: Pulmonary effort is normal. No respiratory distress.     Breath sounds: Normal breath sounds. No wheezing or rales.  Chest:     Chest wall: No tenderness.  Abdominal:     General: Bowel sounds are normal. There is no distension.     Palpations: Abdomen is soft. There is no mass.     Tenderness: There is no abdominal tenderness. There is no guarding or rebound.  Musculoskeletal: Normal range of motion.        General: No tenderness.  Lymphadenopathy:     Cervical: No cervical adenopathy.  Skin:    General: Skin is warm and dry.     Findings: No rash.  Neurological:     Mental Status: She is alert and oriented to person, place, and time.  Psychiatric:        Mood and Affect: Mood normal.        Behavior: Behavior normal.        Thought Content: Thought content normal.        Judgment: Judgment normal.      Depression Screen PHQ 2/9 Scores 02/08/2019 12/17/2017  PHQ - 2 Score 0 0       Assessment & Plan:     Routine Health Maintenance and Physical Exam  Exercise Activities and Dietary recommendations Goals   None     Immunization History  Administered Date(s) Administered   Td 12/17/2017   Tdap 06/17/2006    Health Maintenance  Topic Date Due   HIV Screening  02/10/1989   INFLUENZA VACCINE  12/31/2018   PAP SMEAR-Modifier  11/04/2019   TETANUS/TDAP  12/18/2027     Discussed health benefits of physical activity, and encouraged her to engage in regular exercise appropriate for  her age and condition.    1. Encounter for annual physical exam Normal physical exam today. Will check labs as below and f/u pending lab results. If labs are stable and WNL she will not need to have these rechecked for one year at her next annual physical exam. She is to call the office in the meantime if she has any acute issue, questions or concerns. - CBC with Differential/Platelet - Comprehensive metabolic panel - Lipid panel - Hemoglobin A1c - TSH - HIV Antibody (routine testing w rflx) - Vitamin D (25 hydroxy) - Fe+TIBC+Fer  2. Screening for breast cancer Breast exam today was normal. There is no family history of breast cancer. She does perform regular self breast exams. Mammogram was ordered as below. Information [facility] was given to patient so she may schedule her mammogram at her convenience. - MM 3D SCREEN BREAST BILATERAL; Future  3. Anemia, unspecified type Takes OTC iron supplement. Will check labs as below and f/u pending results. - CBC with Differential/Platelet - Comprehensive metabolic panel - Vitamin D (25 hydroxy) - Fe+TIBC+Fer  4. Avitaminosis D H/O this. Not on supplementation specifically for, but does take a multivitamin.  - CBC with Differential/Platelet - Comprehensive  metabolic panel - Vitamin D (25 hydroxy)  5. Need for influenza vaccination Flu vaccine given today without complication. Patient sat upright for 15 minutes to check for adverse reaction before being released. - Flu Vaccine QUAD 36+ mos IM  --------------------------------------------------------------------    Mar Daring, PA-C  [facility]

## 2019-02-08 ENCOUNTER — Other Ambulatory Visit: Payer: Self-pay

## 2019-02-08 ENCOUNTER — Ambulatory Visit (INDEPENDENT_AMBULATORY_CARE_PROVIDER_SITE_OTHER): Payer: BC Managed Care – PPO | Admitting: Physician Assistant

## 2019-02-08 ENCOUNTER — Encounter: Payer: Self-pay | Admitting: Physician Assistant

## 2019-02-08 VITALS — BP 133/83 | HR 73 | Temp 97.5°F | Resp 16 | Ht 63.0 in | Wt 143.6 lb

## 2019-02-08 DIAGNOSIS — Z23 Encounter for immunization: Secondary | ICD-10-CM | POA: Diagnosis not present

## 2019-02-08 DIAGNOSIS — E559 Vitamin D deficiency, unspecified: Secondary | ICD-10-CM

## 2019-02-08 DIAGNOSIS — Z0000 Encounter for general adult medical examination without abnormal findings: Secondary | ICD-10-CM

## 2019-02-08 DIAGNOSIS — E7800 Pure hypercholesterolemia, unspecified: Secondary | ICD-10-CM | POA: Diagnosis not present

## 2019-02-08 DIAGNOSIS — D649 Anemia, unspecified: Secondary | ICD-10-CM | POA: Diagnosis not present

## 2019-02-08 DIAGNOSIS — Z1239 Encounter for other screening for malignant neoplasm of breast: Secondary | ICD-10-CM

## 2019-02-08 NOTE — Patient Instructions (Signed)
Health Maintenance, Female Adopting a healthy lifestyle and getting preventive care are important in promoting health and wellness. Ask your health care provider about:  The right schedule for you to have regular tests and exams.  Things you can do on your own to prevent diseases and keep yourself healthy. What should I know about diet, weight, and exercise? Eat a healthy diet   Eat a diet that includes plenty of vegetables, fruits, low-fat dairy products, and lean protein.  Do not eat a lot of foods that are high in solid fats, added sugars, or sodium. Maintain a healthy weight Body mass index (BMI) is used to identify weight problems. It estimates body fat based on height and weight. Your health care provider can help determine your BMI and help you achieve or maintain a healthy weight. Get regular exercise Get regular exercise. This is one of the most important things you can do for your health. Most adults should:  Exercise for at least 150 minutes each week. The exercise should increase your heart rate and make you sweat (moderate-intensity exercise).  Do strengthening exercises at least twice a week. This is in addition to the moderate-intensity exercise.  Spend less time sitting. Even light physical activity can be beneficial. Watch cholesterol and blood lipids Have your blood tested for lipids and cholesterol at 45 years of age, then have this test every 5 years. Have your cholesterol levels checked more often if:  Your lipid or cholesterol levels are high.  You are older than 45 years of age.  You are at high risk for heart disease. What should I know about cancer screening? Depending on your health history and family history, you may need to have cancer screening at various ages. This may include screening for:  Breast cancer.  Cervical cancer.  Colorectal cancer.  Skin cancer.  Lung cancer. What should I know about heart disease, diabetes, and high blood  pressure? Blood pressure and heart disease  High blood pressure causes heart disease and increases the risk of stroke. This is more likely to develop in people who have high blood pressure readings, are of African descent, or are overweight.  Have your blood pressure checked: ? Every 3-5 years if you are 18-39 years of age. ? Every year if you are 40 years old or older. Diabetes Have regular diabetes screenings. This checks your fasting blood sugar level. Have the screening done:  Once every three years after age 40 if you are at a normal weight and have a low risk for diabetes.  More often and at a younger age if you are overweight or have a high risk for diabetes. What should I know about preventing infection? Hepatitis B If you have a higher risk for hepatitis B, you should be screened for this virus. Talk with your health care provider to find out if you are at risk for hepatitis B infection. Hepatitis C Testing is recommended for:  Everyone born from 1945 through 1965.  Anyone with known risk factors for hepatitis C. Sexually transmitted infections (STIs)  Get screened for STIs, including gonorrhea and chlamydia, if: ? You are sexually active and are younger than 45 years of age. ? You are older than 45 years of age and your health care provider tells you that you are at risk for this type of infection. ? Your sexual activity has changed since you were last screened, and you are at increased risk for chlamydia or gonorrhea. Ask your health care provider if   you are at risk.  Ask your health care provider about whether you are at high risk for HIV. Your health care provider may recommend a prescription medicine to help prevent HIV infection. If you choose to take medicine to prevent HIV, you should first get tested for HIV. You should then be tested every 3 months for as long as you are taking the medicine. Pregnancy  If you are about to stop having your period (premenopausal) and  you may become pregnant, seek counseling before you get pregnant.  Take 400 to 800 micrograms (mcg) of folic acid every day if you become pregnant.  Ask for birth control (contraception) if you want to prevent pregnancy. Osteoporosis and menopause Osteoporosis is a disease in which the bones lose minerals and strength with aging. This can result in bone fractures. If you are 65 years old or older, or if you are at risk for osteoporosis and fractures, ask your health care provider if you should:  Be screened for bone loss.  Take a calcium or vitamin D supplement to lower your risk of fractures.  Be given hormone replacement therapy (HRT) to treat symptoms of menopause. Follow these instructions at home: Lifestyle  Do not use any products that contain nicotine or tobacco, such as cigarettes, e-cigarettes, and chewing tobacco. If you need help quitting, ask your health care provider.  Do not use street drugs.  Do not share needles.  Ask your health care provider for help if you need support or information about quitting drugs. Alcohol use  Do not drink alcohol if: ? Your health care provider tells you not to drink. ? You are pregnant, may be pregnant, or are planning to become pregnant.  If you drink alcohol: ? Limit how much you use to 0-1 drink a day. ? Limit intake if you are breastfeeding.  Be aware of how much alcohol is in your drink. In the [place] one drink equals one 12 oz bottle of beer (355 mL), one 5 oz glass of wine (148 mL), or one 1 oz glass of hard liquor (44 mL). General instructions  Schedule regular health, dental, and eye exams.  Stay current with your vaccines.  Tell your health care provider if: ? You often feel depressed. ? You have ever been abused or do not feel safe at home. Summary  Adopting a healthy lifestyle and getting preventive care are important in promoting health and wellness.  Follow your health care provider's instructions about healthy  diet, exercising, and getting tested or screened for diseases.  Follow your health care provider's instructions on monitoring your cholesterol and blood pressure. This information is not intended to replace advice given to you by your health care provider. Make sure you discuss any questions you have with your health care provider. Document Released: 12/01/2010 Document Revised: 05/11/2018 Document Reviewed: 05/11/2018 Elsevier Patient Education  2020 [employer]  

## 2019-02-09 ENCOUNTER — Telehealth: Payer: Self-pay

## 2019-02-09 LAB — COMPREHENSIVE METABOLIC PANEL
ALT: 8 IU/L (ref 0–32)
AST: 13 IU/L (ref 0–40)
Albumin/Globulin Ratio: 1.9 (ref 1.2–2.2)
Albumin: 4.4 g/dL (ref 3.8–4.8)
Alkaline Phosphatase: 55 IU/L (ref 39–117)
BUN/Creatinine Ratio: 12 (ref 9–23)
BUN: 8 mg/dL (ref 6–24)
Bilirubin Total: 0.2 mg/dL (ref 0.0–1.2)
CO2: 22 mmol/L (ref 20–29)
Calcium: 8.9 mg/dL (ref 8.7–10.2)
Chloride: 105 mmol/L (ref 96–106)
Creatinine, Ser: 0.66 mg/dL (ref 0.57–1.00)
GFR calc Af Amer: 124 mL/min/{1.73_m2} (ref >59)
GFR calc non Af Amer: 108 mL/min/{1.73_m2} (ref >59)
Globulin, Total: 2.3 g/dL (ref 1.5–4.5)
Glucose: 84 mg/dL (ref 65–99)
Potassium: 4.1 mmol/L (ref 3.5–5.2)
Sodium: 139 mmol/L (ref 134–144)
Total Protein: 6.7 g/dL (ref 6.0–8.5)

## 2019-02-09 LAB — CBC WITH DIFFERENTIAL/PLATELET
Basophils Absolute: 0 10*3/uL (ref 0.0–0.2)
Basos: 1 %
EOS (ABSOLUTE): 0.1 10*3/uL (ref 0.0–0.4)
Eos: 2 %
Hematocrit: 33.6 % — ABNORMAL LOW (ref 34.0–46.6)
Hemoglobin: 10.2 g/dL — ABNORMAL LOW (ref 11.1–15.9)
Immature Grans (Abs): 0 10*3/uL (ref 0.0–0.1)
Immature Granulocytes: 0 %
Lymphocytes Absolute: 1.8 10*3/uL (ref 0.7–3.1)
Lymphs: 32 %
MCH: 24.9 pg — ABNORMAL LOW (ref 26.6–33.0)
MCHC: 30.4 g/dL — ABNORMAL LOW (ref 31.5–35.7)
MCV: 82 fL (ref 79–97)
Monocytes Absolute: 0.4 10*3/uL (ref 0.1–0.9)
Monocytes: 6 %
Neutrophils Absolute: 3.2 10*3/uL (ref 1.4–7.0)
Neutrophils: 59 %
Platelets: 413 10*3/uL (ref 150–450)
RBC: 4.09 x10E6/uL (ref 3.77–5.28)
RDW: 15.4 % (ref 11.7–15.4)
WBC: 5.5 10*3/uL (ref 3.4–10.8)

## 2019-02-09 LAB — VITAMIN D 25 HYDROXY (VIT D DEFICIENCY, FRACTURES): Vit D, 25-Hydroxy: 47.9 ng/mL (ref 30.0–100.0)

## 2019-02-09 LAB — IRON,TIBC AND FERRITIN PANEL
Ferritin: 6 ng/mL — ABNORMAL LOW (ref 15–150)
Iron Saturation: 5 % — CL (ref 15–55)
Iron: 18 ug/dL — ABNORMAL LOW (ref 27–159)
Total Iron Binding Capacity: 372 ug/dL (ref 250–450)
UIBC: 354 ug/dL (ref 131–425)

## 2019-02-09 LAB — HIV ANTIBODY (ROUTINE TESTING W REFLEX): HIV Screen 4th Generation wRfx: NONREACTIVE

## 2019-02-09 LAB — LIPID PANEL
Chol/HDL Ratio: 3.1 ratio (ref 0.0–4.4)
Cholesterol, Total: 174 mg/dL (ref 100–199)
HDL: 56 mg/dL (ref >39)
LDL Chol Calc (NIH): 107 mg/dL — ABNORMAL HIGH (ref 0–99)
Triglycerides: 57 mg/dL (ref 0–149)
VLDL Cholesterol Cal: 11 mg/dL (ref 5–40)

## 2019-02-09 LAB — HEMOGLOBIN A1C
Est. average glucose Bld gHb Est-mCnc: 108 mg/dL
Hgb A1c MFr Bld: 5.4 % (ref 4.8–5.6)

## 2019-02-09 LAB — TSH: TSH: 2.1 u[IU]/mL (ref 0.450–4.500)

## 2019-02-09 NOTE — Telephone Encounter (Signed)
-----   Message from Mar Daring, PA-C sent at 02/09/2019  8:36 AM EDT ----- Hemoglobin down slightly to 10.2 from 10.6. Iron stores are significantly low. I would restart iron supplement or increase if already taking. Let me know if taking and how many and I can tell you what to increase to. Kidney and liver function are normal. Sodium, potassium and calcium are normal. Sugar is normal. Thyroid is normal. Cholesterol is normal. HIV screen done once in a lifetime is negative. Vit D is normal.

## 2019-02-09 NOTE — Telephone Encounter (Signed)
Viewed by [employer]itt Shorts Merle on 02/09/2019 8:40 AM

## 2019-04-10 ENCOUNTER — Ambulatory Visit
Admission: RE | Admit: 2019-04-10 | Discharge: 2019-04-10 | Disposition: A | Discharge status: Home / Self Care | Payer: BC Managed Care – PPO | Source: Ambulatory Visit | Attending: Family Medicine | Admitting: Family Medicine

## 2019-04-10 DIAGNOSIS — Z1231 Encounter for screening mammogram for malignant neoplasm of breast: Secondary | ICD-10-CM

## 2019-04-10 DIAGNOSIS — R920 Mammographic microcalcification found on diagnostic imaging of breast: Secondary | ICD-10-CM | POA: Diagnosis not present

## 2019-04-10 DIAGNOSIS — R921 Mammographic calcification found on diagnostic imaging of breast: Secondary | ICD-10-CM

## 2019-07-10 ENCOUNTER — Other Ambulatory Visit: Payer: Self-pay | Admitting: Neurology

## 2019-07-10 DIAGNOSIS — D508 Other iron deficiency anemias: Secondary | ICD-10-CM

## 2019-10-11 ENCOUNTER — Encounter: Payer: Self-pay | Admitting: Physician Assistant

## 2020-01-24 ENCOUNTER — Telehealth: Payer: Self-pay | Admitting: Family Medicine

## 2020-01-24 NOTE — Telephone Encounter (Signed)
Noted for migraines.

## 2020-01-24 NOTE — Telephone Encounter (Signed)
Pt understands that although there may be some limitations with this type of visit, we will take all precautions to reduce any security or privacy concerns.  Pt understands that this will be treated like an in office visit and we will file with pt's insurance, and there may be a patient responsible charge related to this service.

## 2020-01-31 ENCOUNTER — Telehealth (INDEPENDENT_AMBULATORY_CARE_PROVIDER_SITE_OTHER): Payer: BC Managed Care – PPO | Admitting: Adult Health

## 2020-01-31 DIAGNOSIS — G43009 Migraine without aura, not intractable, without status migrainosus: Secondary | ICD-10-CM
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
## 2020-04-24 ENCOUNTER — Other Ambulatory Visit: Payer: Self-pay

## 2020-04-24 DIAGNOSIS — D508 Other iron deficiency anemias: Secondary | ICD-10-CM

## 2020-04-24 MED ORDER — SUMATRIPTAN SUCCINATE 100 MG PO TABS: 100.0000 mg | ORAL_TABLET | Freq: Once | ORAL | 1 refills | Status: DC | PRN

## 2020-05-10 ENCOUNTER — Encounter: Payer: Self-pay | Admitting: Adult Health

## 2020-05-10 DIAGNOSIS — D508 Other iron deficiency anemias: Secondary | ICD-10-CM

## 2020-05-13 MED ORDER — SUMATRIPTAN SUCCINATE 100 MG PO TABS: 100.0000 mg | ORAL_TABLET | Freq: Once | ORAL | 11 refills | Status: DC | PRN

## 2020-08-23 DIAGNOSIS — D2261 Melanocytic nevi of right upper limb, including shoulder: Secondary | ICD-10-CM | POA: Diagnosis not present

## 2020-08-23 DIAGNOSIS — D225 Melanocytic nevi of trunk: Secondary | ICD-10-CM | POA: Diagnosis not present

## 2020-08-23 DIAGNOSIS — D2272 Melanocytic nevi of left lower limb, including hip: Secondary | ICD-10-CM | POA: Diagnosis not present

## 2020-08-23 DIAGNOSIS — D2262 Melanocytic nevi of left upper limb, including shoulder: Secondary | ICD-10-CM | POA: Diagnosis not present

## 2020-09-12 ENCOUNTER — Telehealth: Payer: Self-pay

## 2020-09-12 DIAGNOSIS — R920 Mammographic microcalcification found on diagnostic imaging of breast: Secondary | ICD-10-CM

## 2020-09-12 NOTE — Telephone Encounter (Signed)
Patient has returned missed call from practice regarding this matter Please contact again when possible

## 2020-09-12 NOTE — Telephone Encounter (Signed)
Copied from [place] [id number]. Topic: General - Other >> Sep 12, 2020 11:28 AM Tessa Lerner A wrote: Reason for CRM: Patient would like to be contacted by a member of administrative staff when possible  Patient was told by [facility] that they were unable to have a diagnostic mammogram until they have been seen by their PCP for a Physical  Patient has an appt to see their PCP on 04/10/21 at 1:20 for a Physical  Patient has concerns related to delaying their mammogram until November and would like to address them further   Please contact to advise when possible

## 2020-09-12 NOTE — Telephone Encounter (Signed)
Diagnostic mammogram has been ordered

## 2020-09-16 ENCOUNTER — Telehealth: Payer: Self-pay | Admitting: Family Medicine

## 2020-09-16 DIAGNOSIS — R920 Mammographic microcalcification found on diagnostic imaging of breast: Secondary | ICD-10-CM

## 2020-09-16 NOTE — Telephone Encounter (Signed)
Ordered

## 2020-09-16 NOTE — Telephone Encounter (Signed)
Per [place] [facility] order for mammogram will need to be changed to [id number]. All mammograms must be ordered as TOMO,Thanks

## 2020-10-04 ENCOUNTER — Other Ambulatory Visit: Payer: Self-pay | Admitting: Family Medicine

## 2020-10-04 ENCOUNTER — Ambulatory Visit
Admission: RE | Admit: 2020-10-04 | Discharge: 2020-10-04 | Disposition: A | Discharge status: Home / Self Care | Payer: BC Managed Care – PPO | Source: Ambulatory Visit | Attending: Family Medicine | Admitting: Family Medicine

## 2020-10-04 ENCOUNTER — Telehealth: Payer: Self-pay

## 2020-10-04 ENCOUNTER — Other Ambulatory Visit: Payer: Self-pay

## 2020-10-04 DIAGNOSIS — N6310 Unspecified lump in the right breast, unspecified quadrant: Secondary | ICD-10-CM

## 2020-10-04 DIAGNOSIS — R920 Mammographic microcalcification found on diagnostic imaging of breast: Secondary | ICD-10-CM | POA: Insufficient documentation

## 2020-10-04 DIAGNOSIS — R921 Mammographic calcification found on diagnostic imaging of breast: Secondary | ICD-10-CM | POA: Diagnosis not present

## 2020-10-04 DIAGNOSIS — R922 Inconclusive mammogram: Secondary | ICD-10-CM | POA: Diagnosis not present

## 2020-10-04 NOTE — Telephone Encounter (Signed)
FYI.... Verbal given to [employer].   Thanks,   -Mickel Baas

## 2020-10-04 NOTE — Telephone Encounter (Signed)
Copied from [place] [id number]. Topic: General - Other >> Oct 04, 2020 12:11 PM Erick Blinks wrote: Reason for CRM: Order request for Right breast ultrasound, pt found a mass. Lattie Haw will put the order in epic for PCP to sign   Lattie Haw from [facility] contact: [phone] >> Oct 04, 2020  1:31 PM [place], Apolonio Schneiders wrote: Lattie Haw from [facility] calling to follow up, please call back as soon as possible, she just needs a verbal order for the ultrasound of the pts right breast. Pt is currently waiting in the office for the orders to come in. [phone]

## 2020-10-07 NOTE — Telephone Encounter (Signed)
Noted  

## 2021-01-21 ENCOUNTER — Telehealth (INDEPENDENT_AMBULATORY_CARE_PROVIDER_SITE_OTHER): Payer: BC Managed Care – PPO | Admitting: Adult Health

## 2021-01-21 DIAGNOSIS — G43009 Migraine without aura, not intractable, without status migrainosus: Secondary | ICD-10-CM | POA: Diagnosis not present

## 2021-01-21 MED ORDER — SUMATRIPTAN SUCCINATE 100 MG PO TABS: 100.0000 mg | ORAL_TABLET | Freq: Once | ORAL | 11 refills | Status: DC | PRN
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
## 2021-02-28 DIAGNOSIS — H1045 Other chronic allergic conjunctivitis: Secondary | ICD-10-CM | POA: Diagnosis not present

## 2021-04-08 NOTE — Progress Notes (Signed)
Complete physical exam   Patient: Diane Curry   DOB: 12-21-1975   45 y.o. Female  MRN: [id number] Visit Date: 04/10/2021  Today's healthcare provider: Lavon Paganini, MD   Chief Complaint  Patient presents with   Annual Exam    Subjective    Diane Curry is a 45 y.o. female who presents today for a complete physical exam.  She reports consuming a  limit carbs and more protein  diet. Home exercise routine includes walking a [employer] Class. She generally feels well. She reports sleeping well. She does not have additional problems to discuss today.  HPI  Pap:11/03/16-Normal, HPV-Negative Mammogram:10/04/20-Repeat in 1 year  Past Medical History:  Diagnosis Date   Anemia    Menstrual periods irregular    Vitamin D deficiency    Past Surgical History:  Procedure Laterality Date   DILATION AND CURETTAGE OF UTERUS  2013   SAB at 10-12 weeks, warranted PRBC and admission overnight. 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  Tobacco Use   Smoking status: Never   Smokeless tobacco: Never  Vaping Use   Vaping Use: Never used  Substance and Sexual Activity   Alcohol use: Yes    Comment: Once per month; wine or mixed drinks   Drug use: No   Sexual activity: Yes  Other Topics Concern   Not on file  Social History Narrative   Not on file   Social Determinants of Health   Financial Resource Strain: Not on file  Food Insecurity: Not on file  Transportation Needs: Not on file  Physical Activity: Not on file  Stress: Not on file  Social Connections: Not on file  Intimate Partner  Violence: Not on file   Family Status  Relation Name Status   Mother  Alive       anemia iron deficiency   Father  Alive       BPH   Sister 1 Alive       Cervical Dysplasia   Brother 1 Alive   Sister 2 Alive       Obese   Sister 3 Alive   Brother 2 Alive   Brother 3 Alive   Brother 4 Alive   MGM  Deceased   MGF  Deceased   PGM  Deceased   PGF  Deceased   Neg Hx  (Not Specified)   Family History  Problem Relation Age of Onset   Osteoporosis Mother    Diabetes Father        non- insulin Dependent   Hypertension Father    Other Sister        produces "extra CSF" that causes visual abnormalities   Hypertension Maternal Grandfather    Breast cancer Paternal Grandmother 74   Stroke Paternal Grandfather    Hypertension Paternal Grandfather    Colon cancer Neg Hx    Ovarian cancer Neg Hx    Cervical cancer Neg Hx    No Known Allergies  Patient Care Team: Virginia Crews, MD  as PCP - General (Family Medicine)   Medications: Outpatient Medications Prior to Visit  Medication Sig   acetaminophen (TYLENOL) 325 MG tablet Take 650 mg by mouth every 6 (six) hours as needed.   FERROUS GLUCONATE PO Take by mouth.   ibuprofen (ADVIL,MOTRIN) 200 MG tablet Take 200 mg by mouth every 6 (six) hours as needed.   loratadine (CLARITIN) 10 MG tablet Take 10 mg by mouth daily.   Multiple Vitamins-Minerals (WOMENS MULTIVITAMIN PO) Take by mouth.   SUMAtriptan (IMITREX) 100 MG tablet Take 1 tablet (100 mg total) by mouth once as needed for up to 1 dose. May repeat in 2 hours if headache persists or recurs.   No facility-administered medications prior to visit.    Review of Systems  Constitutional: Negative.   HENT: Negative.    Eyes: Negative.   Respiratory: Negative.    Cardiovascular: Negative.   Gastrointestinal: Negative.   Endocrine: Negative.   Genitourinary: Negative.   Musculoskeletal: Negative.   Skin: Negative.   Allergic/Immunologic: Negative.   Neurological:  Negative.   Hematological: Negative.   Psychiatric/Behavioral: Negative.     Last CBC Lab Results  Component Value Date   WBC 7.9 04/10/2021   HGB 11.0 (L) 04/10/2021   HCT 35.3 04/10/2021   MCV 83 04/10/2021   MCH 25.7 (L) 04/10/2021   RDW 14.5 04/10/2021   PLT 406 41/74/0814   Last metabolic panel Lab Results  Component Value Date   GLUCOSE 92 04/10/2021   NA 135 04/10/2021   K 3.7 04/10/2021   CL 101 04/10/2021   CO2 22 04/10/2021   BUN 12 04/10/2021   CREATININE 0.79 04/10/2021   GFRNONAA 108 02/08/2019   CALCIUM 9.4 04/10/2021   PROT 7.2 04/10/2021   ALBUMIN 4.7 04/10/2021   LABGLOB 2.5 04/10/2021   AGRATIO 1.9 04/10/2021   BILITOT 0.4 04/10/2021   ALKPHOS 49 04/10/2021   AST 11 04/10/2021   ALT 10 04/10/2021   ANIONGAP 8 12/10/2018   Last lipids Lab Results  Component Value Date   CHOL 208 (H) 04/10/2021   HDL 57 04/10/2021   LDLCALC 138 (H) 04/10/2021   TRIG 72 04/10/2021   CHOLHDL 3.6 04/10/2021   Last thyroid functions Lab Results  Component Value Date   TSH 2.100 02/08/2019      Objective    BP 126/88 (BP Location: Left Arm, Patient Position: Sitting, Cuff Size: Normal)   Pulse 80   Temp 98.7 F (37.1 C) (Oral)   Resp 16   Ht _0  (1.6 m)   Wt 145 lb 4.8 oz (65.9 kg)   SpO2 100%   BMI 25.74 kg/m  BP Readings from Last 3 Encounters:  04/10/21 126/88  02/08/19 133/83  12/29/18 116/74   Wt Readings from Last 3 Encounters:  04/10/21 145 lb 4.8 oz (65.9 kg)  02/08/19 143 lb 9.6 oz (65.1 kg)  12/29/18 141 lb (64 kg)      Physical Exam Vitals reviewed.  Constitutional:      General: She is not in acute distress.    Appearance: Normal appearance. She is well-developed. She is not diaphoretic.  HENT:     Head: Normocephalic and atraumatic.     Right Ear: Tympanic membrane, ear canal and external ear normal.     Left Ear: Tympanic membrane, ear canal and external ear normal.     Nose: Nose normal.     Mouth/Throat:     Mouth:  Mucous membranes are moist.  Pharynx: Oropharynx is clear. No oropharyngeal exudate.  Eyes:     General: No scleral icterus.    Conjunctiva/sclera: Conjunctivae normal.     Pupils: Pupils are equal, round, and reactive to light.  Neck:     Thyroid: No thyromegaly.  Cardiovascular:     Rate and Rhythm: Normal rate and regular rhythm.     Pulses: Normal pulses.     Heart sounds: Normal heart sounds. No murmur heard. Pulmonary:     Effort: Pulmonary effort is normal. No respiratory distress.     Breath sounds: Normal breath sounds. No wheezing or rales.  Chest:     Comments: Breasts: breasts appear normal, no suspicious masses, no skin or nipple changes or axillary nodes  Abdominal:     General: There is no distension.     Palpations: Abdomen is soft.     Tenderness: There is no abdominal tenderness.  Genitourinary:    Comments: GYN:  External genitalia within normal limits.  Vaginal mucosa pink, moist, normal rugae.  Slightly friable cervix with no lesions, no discharge or bleeding noted on speculum exam.  Bimanual exam revealed normal, nongravid uterus with prolapse.  No cervical motion tenderness. No adnexal masses bilaterally.    Musculoskeletal:        General: No deformity.     Cervical back: Neck supple.     Right lower leg: No edema.     Left lower leg: No edema.  Lymphadenopathy:     Cervical: No cervical adenopathy.  Skin:    General: Skin is warm and dry.     Findings: No rash.  Neurological:     Mental Status: She is alert and oriented to person, place, and time. Mental status is at baseline.     Sensory: No sensory deficit.     Motor: No weakness.     Gait: Gait normal.  Psychiatric:        Mood and Affect: Mood normal.        Behavior: Behavior normal.        Thought Content: Thought content normal.      Last depression screening scores PHQ 2/9 Scores 04/10/2021 02/08/2019 12/17/2017  PHQ - 2 Score 0 0 0   Last fall risk screening Fall Risk  04/10/2021   Falls in the past year? 0  Number falls in past yr: 0  Injury with Fall? 0   Last Audit-C alcohol use screening Alcohol Use Disorder Test (AUDIT) 04/10/2021  1. How often do you have a drink containing alcohol? 2  2. How many drinks containing alcohol do you have on a typical day when you are drinking? 0  3. How often do you have six or more drinks on one occasion? 0  AUDIT-C Score 2  Alcohol Brief Interventions/Follow-up -   A score of 3 or more in women, and 4 or more in men indicates increased risk for alcohol abuse, EXCEPT if all of the points are from question 1   Results for orders placed or performed in visit on 04/10/21  CBC with Differential/Platelet  Result Value Ref Range   WBC 7.9 3.4 - 10.8 x10E3/uL   RBC 4.28 3.77 - 5.28 x10E6/uL   Hemoglobin 11.0 (L) 11.1 - 15.9 g/dL   Hematocrit 35.3 34.0 - 46.6 %   MCV 83 79 - 97 fL   MCH 25.7 (L) 26.6 - 33.0 pg   MCHC 31.2 (L) 31.5 - 35.7 g/dL   RDW 14.5 11.7 - 15.4 %  Platelets 406 150 - 450 x10E3/uL   Neutrophils 61 Not Estab. %   Lymphs 31 Not Estab. %   Monocytes 6 Not Estab. %   Eos 1 Not Estab. %   Basos 1 Not Estab. %   Neutrophils Absolute 4.8 1.4 - 7.0 x10E3/uL   Lymphocytes Absolute 2.5 0.7 - 3.1 x10E3/uL   Monocytes Absolute 0.5 0.1 - 0.9 x10E3/uL   EOS (ABSOLUTE) 0.1 0.0 - 0.4 x10E3/uL   Basophils Absolute 0.0 0.0 - 0.2 x10E3/uL   Immature Granulocytes 0 Not Estab. %   Immature Grans (Abs) 0.0 0.0 - 0.1 x10E3/uL  Comprehensive metabolic panel  Result Value Ref Range   Glucose 92 70 - 99 mg/dL   BUN 12 6 - 24 mg/dL   Creatinine, Ser 0.79 0.57 - 1.00 mg/dL   eGFR 93 >59 mL/min/1.73   BUN/Creatinine Ratio 15 9 - 23   Sodium 135 134 - 144 mmol/L   Potassium 3.7 3.5 - 5.2 mmol/L   Chloride 101 96 - 106 mmol/L   CO2 22 20 - 29 mmol/L   Calcium 9.4 8.7 - 10.2 mg/dL   Total Protein 7.2 6.0 - 8.5 g/dL   Albumin 4.7 3.8 - 4.8 g/dL   Globulin, Total 2.5 1.5 - 4.5 g/dL   Albumin/Globulin Ratio 1.9 1.2 - 2.2    Bilirubin Total 0.4 0.0 - 1.2 mg/dL   Alkaline Phosphatase 49 44 - 121 IU/L   AST 11 0 - 40 IU/L   ALT 10 0 - 32 IU/L  Lipid panel  Result Value Ref Range   Cholesterol, Total 208 (H) 100 - 199 mg/dL   Triglycerides 72 0 - 149 mg/dL   HDL 57 >39 mg/dL   VLDL Cholesterol Cal 13 5 - 40 mg/dL   LDL Chol Calc (NIH) 138 (H) 0 - 99 mg/dL   Chol/HDL Ratio 3.6 0.0 - 4.4 ratio  Fe+TIBC+Fer  Result Value Ref Range   Total Iron Binding Capacity 382 250 - 450 ug/dL   UIBC 322 131 - 425 ug/dL   Iron 60 27 - 159 ug/dL   Iron Saturation 16 15 - 55 %   Ferritin 6 (L) 15 - 150 ng/mL  Hepatitis C antibody  Result Value Ref Range   Hep C Virus Ab <0.1 0.0 - 0.9 s/co ratio  VITAMIN D 25 Hydroxy (Vit-D Deficiency, Fractures)  Result Value Ref Range   Vit D, 25-Hydroxy 33.9 30.0 - 100.0 ng/mL    Assessment & Plan    Routine Health Maintenance and Physical Exam  Exercise Activities and Dietary recommendations  Goals   None     Immunization History  Administered Date(s) Administered   Influenza,inj,Quad PF,6+ Mos 02/08/2019   Influenza-Unspecified 02/16/2018, 03/28/2021   PFIZER(Purple Top)SARS-COV-2 Vaccination 06/16/2019, 07/07/2019   Pfizer Covid-19 Vaccine Bivalent Booster 80yr & up 03/22/2020   Td 12/17/2017   Tdap 06/17/2006    Health Maintenance  Topic Date Due   COLONOSCOPY (Pts 45-457yrInsurance coverage will need to be confirmed)  Never done   PAP SMEAR-Modifier  11/03/2021   TETANUS/TDAP  12/18/2027   INFLUENZA VACCINE  Completed   COVID-19 Vaccine  Completed   Hepatitis C Screening  Completed   HIV Screening  Completed   Pneumococcal Vaccine [id number] old  Aged Out   HPV VACCINES  Aged Out    Discussed health benefits of physical activity, and encouraged her to engage in regular exercise appropriate for her age and condition.  Problem List Items Addressed This Visit  Other   Absolute anemia   Relevant Orders   CBC with Differential/Platelet (Completed)    Fe+TIBC+Fer (Completed)   Avitaminosis D   Relevant Orders   VITAMIN D 25 Hydroxy (Vit-D Deficiency, Fractures) (Completed)   Other Visit Diagnoses     Encounter for annual health examination    -  Primary   Relevant Orders   CBC with Differential/Platelet (Completed)   Comprehensive metabolic panel (Completed)   Lipid panel (Completed)   Fe+TIBC+Fer (Completed)   Encounter for hepatitis C screening test for low risk patient       Relevant Orders   Hepatitis C antibody (Completed)   Colon cancer screening       Relevant Orders   Cologuard   Cervical cancer screening       Relevant Orders   Cytology - PAP        Return in about 1 year (around 04/10/2022) for CPE.     I, Lavon Paganini, MD, have reviewed all documentation for this visit. The documentation on 04/11/21 for the exam, diagnosis, procedures, and orders are all accurate and complete.   Cameryn Schum, Dionne Bucy, MD, MPH [facility]

## 2021-04-10 ENCOUNTER — Other Ambulatory Visit: Payer: Self-pay

## 2021-04-10 ENCOUNTER — Other Ambulatory Visit (HOSPITAL_COMMUNITY)
Admission: RE | Admit: 2021-04-10 | Discharge: 2021-04-10 | Disposition: A | Discharge status: Home / Self Care | Payer: BC Managed Care – PPO | Source: Ambulatory Visit | Attending: Family Medicine | Admitting: Family Medicine

## 2021-04-10 ENCOUNTER — Ambulatory Visit (INDEPENDENT_AMBULATORY_CARE_PROVIDER_SITE_OTHER): Payer: BC Managed Care – PPO | Admitting: Family Medicine

## 2021-04-10 ENCOUNTER — Encounter: Payer: Self-pay | Admitting: Family Medicine

## 2021-04-10 VITALS — BP 126/88 | HR 80 | Temp 98.7°F | Resp 16 | Ht 63.0 in | Wt 145.3 lb

## 2021-04-10 DIAGNOSIS — Z124 Encounter for screening for malignant neoplasm of cervix: Secondary | ICD-10-CM | POA: Diagnosis not present

## 2021-04-10 DIAGNOSIS — Z0000 Encounter for general adult medical examination without abnormal findings: Secondary | ICD-10-CM

## 2021-04-10 DIAGNOSIS — E559 Vitamin D deficiency, unspecified: Secondary | ICD-10-CM

## 2021-04-10 DIAGNOSIS — Z1159 Encounter for screening for other viral diseases: Secondary | ICD-10-CM

## 2021-04-10 DIAGNOSIS — Z1211 Encounter for screening for malignant neoplasm of colon: Secondary | ICD-10-CM

## 2021-04-10 DIAGNOSIS — D508 Other iron deficiency anemias: Secondary | ICD-10-CM

## 2021-04-10 DIAGNOSIS — Z13220 Encounter for screening for lipoid disorders: Secondary | ICD-10-CM | POA: Diagnosis not present

## 2021-04-11 LAB — COMPREHENSIVE METABOLIC PANEL
ALT: 10 IU/L (ref 0–32)
AST: 11 IU/L (ref 0–40)
Albumin/Globulin Ratio: 1.9 (ref 1.2–2.2)
Albumin: 4.7 g/dL (ref 3.8–4.8)
Alkaline Phosphatase: 49 IU/L (ref 44–121)
BUN/Creatinine Ratio: 15 (ref 9–23)
BUN: 12 mg/dL (ref 6–24)
Bilirubin Total: 0.4 mg/dL (ref 0.0–1.2)
CO2: 22 mmol/L (ref 20–29)
Calcium: 9.4 mg/dL (ref 8.7–10.2)
Chloride: 101 mmol/L (ref 96–106)
Creatinine, Ser: 0.79 mg/dL (ref 0.57–1.00)
Globulin, Total: 2.5 g/dL (ref 1.5–4.5)
Glucose: 92 mg/dL (ref 70–99)
Potassium: 3.7 mmol/L (ref 3.5–5.2)
Sodium: 135 mmol/L (ref 134–144)
Total Protein: 7.2 g/dL (ref 6.0–8.5)
eGFR: 93 mL/min/{1.73_m2} (ref >59)

## 2021-04-11 LAB — CBC WITH DIFFERENTIAL/PLATELET
Basophils Absolute: 0 10*3/uL (ref 0.0–0.2)
Basos: 1 %
EOS (ABSOLUTE): 0.1 10*3/uL (ref 0.0–0.4)
Eos: 1 %
Hematocrit: 35.3 % (ref 34.0–46.6)
Hemoglobin: 11 g/dL — ABNORMAL LOW (ref 11.1–15.9)
Immature Grans (Abs): 0 10*3/uL (ref 0.0–0.1)
Immature Granulocytes: 0 %
Lymphocytes Absolute: 2.5 10*3/uL (ref 0.7–3.1)
Lymphs: 31 %
MCH: 25.7 pg — ABNORMAL LOW (ref 26.6–33.0)
MCHC: 31.2 g/dL — ABNORMAL LOW (ref 31.5–35.7)
MCV: 83 fL (ref 79–97)
Monocytes Absolute: 0.5 10*3/uL (ref 0.1–0.9)
Monocytes: 6 %
Neutrophils Absolute: 4.8 10*3/uL (ref 1.4–7.0)
Neutrophils: 61 %
Platelets: 406 10*3/uL (ref 150–450)
RBC: 4.28 x10E6/uL (ref 3.77–5.28)
RDW: 14.5 % (ref 11.7–15.4)
WBC: 7.9 10*3/uL (ref 3.4–10.8)

## 2021-04-11 LAB — IRON,TIBC AND FERRITIN PANEL
Ferritin: 6 ng/mL — ABNORMAL LOW (ref 15–150)
Iron Saturation: 16 % (ref 15–55)
Iron: 60 ug/dL (ref 27–159)
Total Iron Binding Capacity: 382 ug/dL (ref 250–450)
UIBC: 322 ug/dL (ref 131–425)

## 2021-04-11 LAB — VITAMIN D 25 HYDROXY (VIT D DEFICIENCY, FRACTURES): Vit D, 25-Hydroxy: 33.9 ng/mL (ref 30.0–100.0)

## 2021-04-11 LAB — LIPID PANEL
Chol/HDL Ratio: 3.6 ratio (ref 0.0–4.4)
Cholesterol, Total: 208 mg/dL — ABNORMAL HIGH (ref 100–199)
HDL: 57 mg/dL (ref >39)
LDL Chol Calc (NIH): 138 mg/dL — ABNORMAL HIGH (ref 0–99)
Triglycerides: 72 mg/dL (ref 0–149)
VLDL Cholesterol Cal: 13 mg/dL (ref 5–40)

## 2021-04-11 LAB — HEPATITIS C ANTIBODY: Hep C Virus Ab: <0.1 s/co ratio (ref 0.0–0.9)

## 2021-04-15 LAB — CYTOLOGY - PAP
Comment: NEGATIVE
Diagnosis: NEGATIVE
High risk HPV: NEGATIVE

## 2021-04-23 DIAGNOSIS — Z1211 Encounter for screening for malignant neoplasm of colon: Secondary | ICD-10-CM | POA: Diagnosis not present

## 2021-04-29 LAB — COLOGUARD: COLOGUARD: NEGATIVE

## 2021-08-01 ENCOUNTER — Other Ambulatory Visit: Payer: Self-pay | Admitting: Family Medicine

## 2021-08-01 ENCOUNTER — Encounter: Payer: Self-pay | Admitting: Family Medicine

## 2021-08-01 DIAGNOSIS — U071 COVID-19: Secondary | ICD-10-CM

## 2021-08-01 MED ORDER — MOLNUPIRAVIR EUA 200MG CAPSULE: 4.0000 | ORAL_CAPSULE | Freq: Two times a day (BID) | ORAL | 0 refills | Status: AC

## 2021-08-05 ENCOUNTER — Encounter: Payer: Self-pay | Admitting: Family Medicine

## 2021-08-25 DIAGNOSIS — D2272 Melanocytic nevi of left lower limb, including hip: Secondary | ICD-10-CM | POA: Diagnosis not present

## 2021-08-25 DIAGNOSIS — D225 Melanocytic nevi of trunk: Secondary | ICD-10-CM | POA: Diagnosis not present

## 2021-08-25 DIAGNOSIS — D2261 Melanocytic nevi of right upper limb, including shoulder: Secondary | ICD-10-CM | POA: Diagnosis not present

## 2021-08-25 DIAGNOSIS — D485 Neoplasm of uncertain behavior of skin: Secondary | ICD-10-CM | POA: Diagnosis not present

## 2021-08-25 DIAGNOSIS — D2262 Melanocytic nevi of left upper limb, including shoulder: Secondary | ICD-10-CM | POA: Diagnosis not present

## 2021-09-09 ENCOUNTER — Other Ambulatory Visit: Payer: Self-pay | Admitting: Family Medicine

## 2021-09-09 DIAGNOSIS — Z1231 Encounter for screening mammogram for malignant neoplasm of breast: Secondary | ICD-10-CM

## 2021-10-14 ENCOUNTER — Ambulatory Visit
Admission: RE | Admit: 2021-10-14 | Discharge: 2021-10-14 | Disposition: A | Discharge status: Home / Self Care | Payer: BC Managed Care – PPO | Source: Ambulatory Visit | Attending: Family Medicine | Admitting: Family Medicine

## 2021-10-14 DIAGNOSIS — Z1231 Encounter for screening mammogram for malignant neoplasm of breast: Secondary | ICD-10-CM | POA: Diagnosis not present

## 2022-02-07 ENCOUNTER — Other Ambulatory Visit: Payer: Self-pay | Admitting: Adult Health

## 2022-02-07 DIAGNOSIS — G43009 Migraine without aura, not intractable, without status migrainosus: Secondary | ICD-10-CM

## 2022-04-10 NOTE — Progress Notes (Unsigned)
I,Lillyonna Armstead S Chaye Misch,acting as a Education administrator for Lavon Paganini, MD.,have documented all relevant documentation on the behalf of Lavon Paganini, MD,as directed by  Lavon Paganini, MD while in the presence of Lavon Paganini, MD.    Complete physical exam   Patient: Diane Curry   DOB: [birth year]   46 y.o. Female  MRN: [id number] Visit Date: 04/14/2022  Today's healthcare provider: Lavon Paganini, MD   Chief Complaint  Patient presents with   Annual Exam   Subjective    Diane Curry is a 46 y.o. female who presents today for a complete physical exam.  She reports consuming a general diet. Home exercise routine includes walking. She generally feels well. She reports sleeping well. She does not have additional problems to discuss today.  HPI   Has scoliosis. Former PCP had given Rx for skelaxin and has run out of refills  Past Medical History:  Diagnosis Date   Anemia    Menstrual periods irregular    Vitamin D deficiency    Past Surgical History:  Procedure Laterality Date   DILATION AND CURETTAGE OF UTERUS  2013   SAB at 10-12 weeks, warranted PRBC and admission overnight. 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  Tobacco Use   Smoking status: Never   Smokeless tobacco: Never  Vaping Use   Vaping Use: Never used  Substance and Sexual Activity   Alcohol use: Yes    Comment: Once per month; wine or mixed drinks   Drug use: No   Sexual activity: Yes  Other Topics Concern   Not on file  Social History Narrative   Not on file   Social Determinants of Health   Financial  Resource Strain: Not on file  Food Insecurity: Not on file  Transportation Needs: Not on file  Physical Activity: Not on file  Stress: Not on file  Social Connections: Not on file  Intimate Partner Violence: Not on file   Family Status  Relation Name Status   Mother  Alive       anemia iron deficiency   Father  Alive       BPH   Sister 1 Alive       Cervical Dysplasia   Brother 1 Alive   Sister 2 Alive       Obese   Sister 3 Alive   Brother 2 Alive   Brother 3 Alive   Brother 4 Alive   MGM  Deceased   MGF  Deceased   PGM  Deceased   PGF  Deceased   Neg Hx  (Not Specified)   Family History  Problem Relation Age of Onset   Osteoporosis Mother    Diabetes Father        non- insulin Dependent   Hypertension Father    Other Sister        produces "extra CSF" that causes visual abnormalities   Hypertension Maternal Grandfather    Breast cancer Paternal Grandmother 52   Stroke Paternal Grandfather    Hypertension Paternal  Grandfather    Colon cancer Neg Hx    Ovarian cancer Neg Hx    Cervical cancer Neg Hx    No Known Allergies  Patient Care Team: Virginia Crews, MD as PCP - General (Family Medicine)   Medications: Outpatient Medications Prior to Visit  Medication Sig   acetaminophen (TYLENOL) 325 MG tablet Take 650 mg by mouth every 6 (six) hours as needed.   FERROUS GLUCONATE PO Take by mouth.   ibuprofen (ADVIL,MOTRIN) 200 MG tablet Take 200 mg by mouth every 6 (six) hours as needed.   loratadine (CLARITIN) 10 MG tablet Take 10 mg by mouth daily.   Multiple Vitamins-Minerals (WOMENS MULTIVITAMIN PO) Take by mouth.   [DISCONTINUED] SUMAtriptan (IMITREX) 100 MG tablet TAKE 1 TABLET (100 MG TOTAL) BY MOUTH ONCE AS NEEDED FOR UP TO 1 DOSE. MAY REPEAT IN 2 HOURS IF HEADACHE PERSISTS OR RECURS.   No facility-administered medications prior to visit.    Review of Systems  All other systems reviewed and are negative.   Last CBC Lab Results  Component Value  Date   WBC 7.9 04/10/2021   HGB 11.0 (L) 04/10/2021   HCT 35.3 04/10/2021   MCV 83 04/10/2021   MCH 25.7 (L) 04/10/2021   RDW 14.5 04/10/2021   PLT 406 80/07/4915   Last metabolic panel Lab Results  Component Value Date   GLUCOSE 92 04/10/2021   NA 135 04/10/2021   K 3.7 04/10/2021   CL 101 04/10/2021   CO2 22 04/10/2021   BUN 12 04/10/2021   CREATININE 0.79 04/10/2021   EGFR 93 04/10/2021   CALCIUM 9.4 04/10/2021   PROT 7.2 04/10/2021   ALBUMIN 4.7 04/10/2021   LABGLOB 2.5 04/10/2021   AGRATIO 1.9 04/10/2021   BILITOT 0.4 04/10/2021   ALKPHOS 49 04/10/2021   AST 11 04/10/2021   ALT 10 04/10/2021   ANIONGAP 8 12/10/2018   Last lipids Lab Results  Component Value Date   CHOL 208 (H) 04/10/2021   HDL 57 04/10/2021   LDLCALC 138 (H) 04/10/2021   TRIG 72 04/10/2021   CHOLHDL 3.6 04/10/2021   Last hemoglobin A1c Lab Results  Component Value Date   HGBA1C 5.4 02/08/2019   Last thyroid functions Lab Results  Component Value Date   TSH 2.100 02/08/2019   Last vitamin D Lab Results  Component Value Date   VD25OH 33.9 04/10/2021      Objective    BP 133/84 (BP Location: Left Arm, Patient Position: Sitting, Cuff Size: Large)   Pulse 66   Temp 98.8 F (37.1 C) (Oral)   Resp 16   Ht _0  (1.6 m)   Wt 158 lb 12.8 oz (72 kg)   BMI 28.13 kg/m  BP Readings from Last 3 Encounters:  04/14/22 133/84  04/10/21 126/88  02/08/19 133/83   Wt Readings from Last 3 Encounters:  04/14/22 158 lb 12.8 oz (72 kg)  04/10/21 145 lb 4.8 oz (65.9 kg)  02/08/19 143 lb 9.6 oz (65.1 kg)     Physical Exam Vitals reviewed.  Constitutional:      General: She is not in acute distress.    Appearance: Normal appearance. She is well-developed. She is not diaphoretic.  HENT:     Head: Normocephalic and atraumatic.     Right Ear: Tympanic membrane, ear canal and external ear normal.     Left Ear: Tympanic membrane, ear canal and external ear normal.     Nose: Nose normal.  Mouth/Throat:     Mouth: Mucous membranes are moist.     Pharynx: Oropharynx is clear. No oropharyngeal exudate.  Eyes:     General: No scleral icterus.    Conjunctiva/sclera: Conjunctivae normal.     Pupils: Pupils are equal, round, and reactive to light.  Neck:     Thyroid: No thyromegaly.  Cardiovascular:     Rate and Rhythm: Normal rate and regular rhythm.     Pulses: Normal pulses.     Heart sounds: Normal heart sounds. No murmur heard. Pulmonary:     Effort: Pulmonary effort is normal. No respiratory distress.     Breath sounds: Normal breath sounds. No wheezing or rales.  Abdominal:     General: There is no distension.     Palpations: Abdomen is soft.     Tenderness: There is no abdominal tenderness.  Musculoskeletal:        General: No deformity.     Cervical back: Neck supple.     Right lower leg: No edema.     Left lower leg: No edema.  Lymphadenopathy:     Cervical: No cervical adenopathy.  Skin:    General: Skin is warm and dry.     Findings: No rash.  Neurological:     Mental Status: She is alert and oriented to person, place, and time. Mental status is at baseline.     Sensory: No sensory deficit.     Motor: No weakness.     Gait: Gait normal.  Psychiatric:        Mood and Affect: Mood normal.        Behavior: Behavior normal.        Thought Content: Thought content normal.      Last depression screening scores    04/14/2022    8:47 AM 04/10/2021    1:29 PM 02/08/2019    9:13 AM  PHQ 2/9 Scores  PHQ - 2 Score 0 0 0  PHQ- 9 Score 2     Last fall risk screening    04/14/2022    8:48 AM  Fall Risk   Falls in the past year? 0  Number falls in past yr: 0  Injury with Fall? 0  Risk for fall due to : No Fall Risks  Follow up Falls evaluation completed   Last Audit-C alcohol use screening    04/14/2022    8:48 AM  Alcohol Use Disorder Test (AUDIT)  1. How often do you have a drink containing alcohol? 2  2. How many drinks containing alcohol do  you have on a typical day when you are drinking? 0  3. How often do you have six or more drinks on one occasion? 0  AUDIT-C Score 2   A score of 3 or more in women, and 4 or more in men indicates increased risk for alcohol abuse, EXCEPT if all of the points are from question 1   No results found for any visits on 04/14/22.  Assessment & Plan    Routine Health Maintenance and Physical Exam  Exercise Activities and Dietary recommendations  Goals   None     Immunization History  Administered Date(s) Administered   Influenza,inj,Quad PF,6+ Mos 02/08/2019   Influenza-Unspecified 02/16/2018, 03/28/2021, 03/06/2022   PFIZER(Purple Top)SARS-COV-2 Vaccination 06/16/2019, 07/07/2019   Pfizer Covid-19 Vaccine Bivalent Booster 33yr & up 03/22/2020   Td 12/17/2017   Tdap 06/17/2006    Health Maintenance  Topic Date Due   MAMMOGRAM  10/15/2023  Fecal DNA (Cologuard)  04/23/2024   PAP SMEAR-Modifier  04/10/2026   TETANUS/TDAP  12/18/2027   INFLUENZA VACCINE  Completed   COVID-19 Vaccine  Completed   Hepatitis C Screening  Completed   HIV Screening  Completed   HPV VACCINES  Aged Out    Discussed health benefits of physical activity, and encouraged her to engage in regular exercise appropriate for her age and condition.  Problem List Items Addressed This Visit       Cardiovascular and Mediastinum   Migraine    No longer seeing Neuro Will continue sumatriptan prn      Relevant Medications   SUMAtriptan (IMITREX) 100 MG tablet   metaxalone (SKELAXIN) 800 MG tablet     Musculoskeletal and Integument   Infantile idiopathic scoliosis of thoracolumbar region    Continue muscle relaxers sparingly        Other   Absolute anemia    Continue to monitor CBC and iron panel Continue iron supplement      Relevant Orders   Iron, TIBC and Ferritin Panel   CBC   Avitaminosis D    Continue supplement Recheck level       Relevant Orders   VITAMIN D 25 Hydroxy (Vit-D  Deficiency, Fractures)   Overweight    Discussed importance of healthy weight management Discussed diet and exercise  Screening labs      Relevant Orders   Comprehensive metabolic panel   Lipid panel   Other Visit Diagnoses     Encounter for annual health examination    -  Primary   Relevant Orders   Iron, TIBC and Ferritin Panel   CBC   Comprehensive metabolic panel   Lipid panel   VITAMIN D 25 Hydroxy (Vit-D Deficiency, Fractures)        Return in about 1 year (around 04/15/2023) for CPE.     I, Lavon Paganini, MD, have reviewed all documentation for this visit. The documentation on 04/14/22 for the exam, diagnosis, procedures, and orders are all accurate and complete.   B[place], Dionne Bucy, MD, MPH [facility]

## 2022-04-14 ENCOUNTER — Encounter: Payer: Self-pay | Admitting: Family Medicine

## 2022-04-14 ENCOUNTER — Ambulatory Visit (INDEPENDENT_AMBULATORY_CARE_PROVIDER_SITE_OTHER): Payer: BC Managed Care – PPO | Admitting: Family Medicine

## 2022-04-14 VITALS — BP 133/84 | HR 66 | Temp 98.8°F | Resp 16 | Ht 63.0 in | Wt 158.8 lb

## 2022-04-14 DIAGNOSIS — E663 Overweight: Secondary | ICD-10-CM

## 2022-04-14 DIAGNOSIS — E559 Vitamin D deficiency, unspecified: Secondary | ICD-10-CM | POA: Diagnosis not present

## 2022-04-14 DIAGNOSIS — D508 Other iron deficiency anemias: Secondary | ICD-10-CM | POA: Diagnosis not present

## 2022-04-14 DIAGNOSIS — Z0000 Encounter for general adult medical examination without abnormal findings: Secondary | ICD-10-CM | POA: Diagnosis not present

## 2022-04-14 DIAGNOSIS — M4105 Infantile idiopathic scoliosis, thoracolumbar region: Secondary | ICD-10-CM

## 2022-04-14 DIAGNOSIS — G43009 Migraine without aura, not intractable, without status migrainosus: Secondary | ICD-10-CM

## 2022-04-14 MED ORDER — SUMATRIPTAN SUCCINATE 100 MG PO TABS: 100.0000 mg | ORAL_TABLET | Freq: Once | ORAL | 11 refills | Status: DC | PRN

## 2022-04-14 MED ORDER — METAXALONE 800 MG PO TABS: 800.0000 mg | ORAL_TABLET | Freq: Three times a day (TID) | ORAL | 1 refills | Status: DC | PRN

## 2022-04-14 NOTE — Assessment & Plan Note (Signed)
Discussed importance of healthy weight management Discussed diet and exercise  Screening labs 

## 2022-04-14 NOTE — Assessment & Plan Note (Signed)
Continue muscle relaxers sparingly

## 2022-04-14 NOTE — Assessment & Plan Note (Signed)
Continue to monitor CBC and iron panel Continue iron supplement

## 2022-04-14 NOTE — Assessment & Plan Note (Signed)
No longer seeing Neuro Will continue sumatriptan prn

## 2022-04-14 NOTE — Assessment & Plan Note (Signed)
Continue supplement Recheck level 

## 2022-04-16 DIAGNOSIS — D649 Anemia, unspecified: Secondary | ICD-10-CM | POA: Diagnosis not present

## 2022-04-16 DIAGNOSIS — E539 Vitamin B deficiency, unspecified: Secondary | ICD-10-CM | POA: Diagnosis not present

## 2022-04-16 DIAGNOSIS — N926 Irregular menstruation, unspecified: Secondary | ICD-10-CM | POA: Diagnosis not present

## 2022-04-16 DIAGNOSIS — Z13220 Encounter for screening for lipoid disorders: Secondary | ICD-10-CM | POA: Diagnosis not present

## 2022-04-16 DIAGNOSIS — E559 Vitamin D deficiency, unspecified: Secondary | ICD-10-CM | POA: Diagnosis not present

## 2022-04-16 DIAGNOSIS — E663 Overweight: Secondary | ICD-10-CM | POA: Diagnosis not present

## 2022-04-16 DIAGNOSIS — D508 Other iron deficiency anemias: Secondary | ICD-10-CM | POA: Diagnosis not present

## 2022-04-16 DIAGNOSIS — D509 Iron deficiency anemia, unspecified: Secondary | ICD-10-CM | POA: Diagnosis not present

## 2022-04-17 ENCOUNTER — Other Ambulatory Visit: Payer: Self-pay

## 2022-04-17 DIAGNOSIS — R790 Abnormal level of blood mineral: Secondary | ICD-10-CM

## 2022-04-17 DIAGNOSIS — D649 Anemia, unspecified: Secondary | ICD-10-CM

## 2022-04-17 LAB — COMPREHENSIVE METABOLIC PANEL
ALT: 13 IU/L (ref 0–32)
AST: 12 IU/L (ref 0–40)
Albumin/Globulin Ratio: 2.2 (ref 1.2–2.2)
Albumin: 4.4 g/dL (ref 3.9–4.9)
Alkaline Phosphatase: 52 IU/L (ref 44–121)
BUN/Creatinine Ratio: 14 (ref 9–23)
BUN: 10 mg/dL (ref 6–24)
Bilirubin Total: 0.4 mg/dL (ref 0.0–1.2)
CO2: 22 mmol/L (ref 20–29)
Calcium: 9 mg/dL (ref 8.7–10.2)
Chloride: 103 mmol/L (ref 96–106)
Creatinine, Ser: 0.72 mg/dL (ref 0.57–1.00)
Globulin, Total: 2 g/dL (ref 1.5–4.5)
Glucose: 95 mg/dL (ref 70–99)
Potassium: 4.3 mmol/L (ref 3.5–5.2)
Sodium: 139 mmol/L (ref 134–144)
Total Protein: 6.4 g/dL (ref 6.0–8.5)
eGFR: 103 mL/min/{1.73_m2} (ref >59)

## 2022-04-17 LAB — CBC
Hematocrit: 31.9 % — ABNORMAL LOW (ref 34.0–46.6)
Hemoglobin: 9.7 g/dL — ABNORMAL LOW (ref 11.1–15.9)
MCH: 23.7 pg — ABNORMAL LOW (ref 26.6–33.0)
MCHC: 30.4 g/dL — ABNORMAL LOW (ref 31.5–35.7)
MCV: 78 fL — ABNORMAL LOW (ref 79–97)
Platelets: 440 10*3/uL (ref 150–450)
RBC: 4.09 x10E6/uL (ref 3.77–5.28)
RDW: 16.4 % — ABNORMAL HIGH (ref 11.7–15.4)
WBC: 5.4 10*3/uL (ref 3.4–10.8)

## 2022-04-17 LAB — LIPID PANEL
Chol/HDL Ratio: 3.4 ratio (ref 0.0–4.4)
Cholesterol, Total: 179 mg/dL (ref 100–199)
HDL: 53 mg/dL (ref >39)
LDL Chol Calc (NIH): 110 mg/dL — ABNORMAL HIGH (ref 0–99)
Triglycerides: 84 mg/dL (ref 0–149)
VLDL Cholesterol Cal: 16 mg/dL (ref 5–40)

## 2022-04-17 LAB — IRON,TIBC AND FERRITIN PANEL
Ferritin: 5 ng/mL — ABNORMAL LOW (ref 15–150)
Iron Saturation: 8 % — CL (ref 15–55)
Iron: 32 ug/dL (ref 27–159)
Total Iron Binding Capacity: 380 ug/dL (ref 250–450)
UIBC: 348 ug/dL (ref 131–425)

## 2022-04-17 LAB — VITAMIN D 25 HYDROXY (VIT D DEFICIENCY, FRACTURES): Vit D, 25-Hydroxy: 28.7 ng/mL — ABNORMAL LOW (ref 30.0–100.0)

## 2022-04-20 ENCOUNTER — Telehealth: Payer: Self-pay

## 2022-04-20 ENCOUNTER — Other Ambulatory Visit: Payer: Self-pay | Admitting: Physician Assistant

## 2022-04-20 DIAGNOSIS — E611 Iron deficiency: Secondary | ICD-10-CM

## 2022-04-20 DIAGNOSIS — R790 Abnormal level of blood mineral: Secondary | ICD-10-CM

## 2022-04-20 NOTE — Telephone Encounter (Signed)
Please advise 

## 2022-04-20 NOTE — Telephone Encounter (Signed)
Pt stated that she is noticing the SOB with  exertion (showers and walking up 1 flight of steps causes -SOB) more after finding ut what her labs are. Pt c/o restless legs and feet and craves ice. Pt asking for iron infusion.

## 2022-04-21 NOTE — Telephone Encounter (Signed)
Called patient to let her know. Okay for PEC to advise.

## 2022-04-21 NOTE — Telephone Encounter (Signed)
Called, left VM to call back. 

## 2022-04-22 NOTE — Telephone Encounter (Signed)
Called patient to let her know there has been a referral placed for hematology , no answer left VM to call back.

## 2022-05-01 ENCOUNTER — Inpatient Hospital Stay: Payer: BC Managed Care – PPO

## 2022-05-01 ENCOUNTER — Inpatient Hospital Stay: Payer: BC Managed Care – PPO | Attending: Oncology | Admitting: Oncology

## 2022-05-01 ENCOUNTER — Encounter: Payer: Self-pay | Admitting: Oncology

## 2022-05-01 VITALS — BP 138/83 | HR 67 | Temp 98.5°F | Wt 158.0 lb

## 2022-05-01 DIAGNOSIS — D509 Iron deficiency anemia, unspecified: Secondary | ICD-10-CM | POA: Diagnosis not present

## 2022-05-01 DIAGNOSIS — D508 Other iron deficiency anemias: Secondary | ICD-10-CM

## 2022-05-01 DIAGNOSIS — Z79899 Other long term (current) drug therapy: Secondary | ICD-10-CM | POA: Diagnosis not present

## 2022-05-01 DIAGNOSIS — E559 Vitamin D deficiency, unspecified: Secondary | ICD-10-CM | POA: Diagnosis not present

## 2022-05-01 DIAGNOSIS — M4105 Infantile idiopathic scoliosis, thoracolumbar region: Secondary | ICD-10-CM | POA: Insufficient documentation

## 2022-05-01 NOTE — Progress Notes (Signed)
Hematology/Oncology Consult note [facility] [phone] [phone]  Patient Care Team: Erasmo Downer, MD as PCP - General (Family Medicine)   Name of the patient: Diane Curry  [id number]  Jan 25, 1976    Reason for referral-iron deficiency anemia   Referring physician-Dr. Beryle Flock  Date of visit: 05/01/22   History of presenting illness- Patient is a 46 year old female referred for iron deficiency anemia.  Most recent CBC from 04/16/2022 showed white count of 5.4, H&H of 9.7/31.9 with an MCV of 78 and a platelet count of 440.  Iron studies showed ferritin level of 5 with an iron saturation of 8%.  Looking back at her prior CBCs her hemoglobin has been around 10 with chronic microcytosis at least dating back to 2018.  Patient reports  having long-term anemia ever since her menarche.  Her menstrual cycle last for 5 days and first couple of days can be particularly heavy.  She denies any bleeding in her stool or urine.  She has not had a colonoscopy but has been doing stool tests.  Denies any consistent use of NSAIDs.  ECOG PS- 0  Pain scale- 0   Review of systems- Review of Systems  Constitutional:  Positive for malaise/fatigue. Negative for chills, fever and weight loss.  HENT:  Negative for congestion, ear discharge and nosebleeds.   Eyes:  Negative for blurred vision.  Respiratory:  Negative for cough, hemoptysis, sputum production, shortness of breath and wheezing.   Cardiovascular:  Negative for chest pain, palpitations, orthopnea and claudication.  Gastrointestinal:  Negative for abdominal pain, blood in stool, constipation, diarrhea, heartburn, melena, nausea and vomiting.  Genitourinary:  Negative for dysuria, flank pain, frequency, hematuria and urgency.  Musculoskeletal:  Negative for back pain, joint pain and myalgias.  Skin:  Negative for rash.  Neurological:  Negative for dizziness, tingling, focal weakness,  seizures, weakness and headaches.  Endo/Heme/Allergies:  Does not bruise/bleed easily.  Psychiatric/Behavioral:  Negative for depression and suicidal ideas. The patient does not have insomnia.     No Known Allergies  Patient Active Problem List   Diagnosis Date Noted   Infantile idiopathic scoliosis of thoracolumbar region 12/30/2018   Overweight 12/17/2017   Migraine 12/26/2016   Absolute anemia 09/03/2015   Avitaminosis D 07/30/2008   Irregular bleeding 07/21/2008   Allergic rhinitis 12/15/2001     Past Medical History:  Diagnosis Date   Anemia    Menstrual periods irregular    Vitamin D deficiency      Past Surgical History:  Procedure Laterality Date   DILATION AND CURETTAGE OF UTERUS  2013   SAB at 10-12 weeks, warranted PRBC and admission overnight. 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  Tobacco Use   Smoking status: Never   Smokeless tobacco: Never  Vaping Use   Vaping Use: Never used  Substance and Sexual Activity   Alcohol use: Yes    Comment: Once per month; wine or mixed drinks   Drug use: No   Sexual activity: Yes  Other Topics Concern   Not on file  Social History Narrative   Not on file   Social Determinants of Health   Financial Resource Strain: Not on file  Food Insecurity: Not on file  Transportation Needs: Not on file  Physical Activity: Not on file  Stress: Not on file  Social Connections: Not on file  Intimate Partner Violence: Not on file     Family History  Problem Relation Age of Onset   Osteoporosis Mother    Diabetes Father        non- insulin  Dependent   Hypertension Father    Other Sister        produces "extra CSF" that causes visual abnormalities   Hypertension Maternal Grandfather    Breast cancer Paternal Grandmother 81   Stroke Paternal Grandfather    Hypertension Paternal Grandfather    Colon cancer Neg Hx    Ovarian cancer Neg Hx    Cervical cancer Neg Hx      Current Outpatient Medications:    acetaminophen (TYLENOL) 325 MG tablet, Take 650 mg by mouth every 6 (six) hours as needed., Disp: , Rfl:    FERROUS GLUCONATE PO, Take by mouth., Disp: , Rfl:    ibuprofen (ADVIL,MOTRIN) 200 MG tablet, Take 200 mg by mouth every 6 (six) hours as needed., Disp: , Rfl:    loratadine (CLARITIN) 10 MG tablet, Take 10 mg by mouth daily., Disp: , Rfl:    metaxalone (SKELAXIN) 800 MG tablet, Take 1 tablet (800 mg total) by mouth 3 (three) times daily as needed for muscle spasms., Disp: 30 tablet, Rfl: 1   Multiple Vitamins-Minerals (WOMENS MULTIVITAMIN PO), Take by mouth., Disp: , Rfl:    SUMAtriptan (IMITREX) 100 MG tablet, Take 1 tablet (100 mg total) by mouth once as needed for up to 1 dose. May repeat in 2 hours if headache persists or recurs., Disp: 10 tablet, Rfl: 11   Physical exam:  Vitals:   05/01/22 1057  BP: 138/83  Pulse: 67  Temp: 98.5 F (36.9 C)  TempSrc: Tympanic  Weight: 158 lb (71.7 kg)   Physical Exam Cardiovascular:     Rate and Rhythm: Normal rate and regular rhythm.     Heart sounds: Normal heart sounds.  Pulmonary:     Effort: Pulmonary effort is normal.     Breath sounds: Normal breath sounds.  Abdominal:     General: Bowel sounds are normal.     Palpations: Abdomen is soft.  Skin:    General: Skin is warm and dry.  Neurological:     Mental Status: She is alert and oriented to person, place, and time.           Latest Ref Rng & Units 04/16/2022    7:52 AM  CMP  Glucose 70 - 99 mg/dL 95   BUN 6 - 24 mg/dL 10   Creatinine 0.57 - 1.00 mg/dL 0.72   Sodium 134 - 144 mmol/L 139    Potassium 3.5 - 5.2 mmol/L 4.3   Chloride 96 - 106 mmol/L 103   CO2 20 - 29 mmol/L 22   Calcium 8.7 - 10.2 mg/dL 9.0   Total Protein 6.0 - 8.5 g/dL 6.4   Total Bilirubin 0.0 - 1.2 mg/dL 0.4   Alkaline Phos 44 - 121 IU/L 52   AST 0 - 40 IU/L 12   ALT 0 - 32 IU/L 13       Latest Ref Rng & Units 04/16/2022  7:52 AM  CBC  WBC 3.4 - 10.8 x10E3/uL 5.4   Hemoglobin 11.1 - 15.9 g/dL 9.7   Hematocrit 34.0 - 46.6 % 31.9   Platelets 150 - 450 x10E3/uL 440     Assessment and plan- Patient is a 46 y.o. female referred for iron deficiency anemia  Patient reports ongoing fatigue and her hemoglobin is decreased to 9.7.  Would be reasonable to proceed with IV iron given that her labs are suggestive of iron deficiency.  Discussed risks and benefits of IV iron including all but not limited to possible risk of infusion reaction.  Patient understands and agrees to proceed as planned.  Based on her insurance she will be getting Venofer x 5.  I will repeat CBC ferritin and iron studies B12 folate celiac disease panel and stool H. pylori antigen in 2 months time.  With regards to etiology of iron deficiency: Likely secondary to menorrhagia.  Ideally patient needs GI workup but she would like to hold off at this time.   Thank you for this kind referral and the opportunity to participate in the care of this patient   Visit Diagnosis 1. Iron deficiency anemia, unspecified iron deficiency anemia type   2. Other iron deficiency anemia     Dr. Randa Evens, MD, MPH [facility] at [facility] [phone] 05/01/2022

## 2022-05-07 MED FILL — Iron Sucrose Inj 20 MG/ML (Fe Equiv): INTRAVENOUS | Qty: 10 | Status: AC

## 2022-05-08 ENCOUNTER — Inpatient Hospital Stay: Payer: BC Managed Care – PPO

## 2022-05-08 VITALS — BP 137/88 | HR 94 | Temp 97.8°F

## 2022-05-08 DIAGNOSIS — E559 Vitamin D deficiency, unspecified: Secondary | ICD-10-CM | POA: Diagnosis not present

## 2022-05-08 DIAGNOSIS — Z79899 Other long term (current) drug therapy: Secondary | ICD-10-CM | POA: Diagnosis not present

## 2022-05-08 DIAGNOSIS — D509 Iron deficiency anemia, unspecified: Secondary | ICD-10-CM | POA: Diagnosis not present

## 2022-05-08 DIAGNOSIS — M4105 Infantile idiopathic scoliosis, thoracolumbar region: Secondary | ICD-10-CM | POA: Diagnosis not present

## 2022-05-08 DIAGNOSIS — D508 Other iron deficiency anemias: Secondary | ICD-10-CM

## 2022-05-08 LAB — PREGNANCY, URINE: Preg Test, Ur: NEGATIVE

## 2022-05-08 MED ORDER — SODIUM CHLORIDE 0.9 % IV SOLN
200.0000 mg | INTRAVENOUS | Status: DC
  Administered 2022-05-08: 200 mg via INTRAVENOUS
  Filled 2022-05-08: qty 10

## 2022-05-08 MED ORDER — SODIUM CHLORIDE 0.9 % IV SOLN
Freq: Once | INTRAVENOUS | Status: AC
  Filled 2022-05-08: qty 250

## 2022-05-15 ENCOUNTER — Inpatient Hospital Stay: Payer: BC Managed Care – PPO

## 2022-05-15 VITALS — BP 113/76 | HR 63 | Temp 97.4°F | Resp 16

## 2022-05-15 DIAGNOSIS — E559 Vitamin D deficiency, unspecified: Secondary | ICD-10-CM | POA: Diagnosis not present

## 2022-05-15 DIAGNOSIS — D508 Other iron deficiency anemias: Secondary | ICD-10-CM

## 2022-05-15 DIAGNOSIS — M4105 Infantile idiopathic scoliosis, thoracolumbar region: Secondary | ICD-10-CM | POA: Diagnosis not present

## 2022-05-15 DIAGNOSIS — D509 Iron deficiency anemia, unspecified: Secondary | ICD-10-CM | POA: Diagnosis not present

## 2022-05-15 DIAGNOSIS — Z79899 Other long term (current) drug therapy: Secondary | ICD-10-CM | POA: Diagnosis not present

## 2022-05-15 MED ORDER — SODIUM CHLORIDE 0.9 % IV SOLN
200.0000 mg | INTRAVENOUS | Status: DC
  Administered 2022-05-15: 200 mg via INTRAVENOUS
  Filled 2022-05-15: qty 200

## 2022-05-15 MED ORDER — SODIUM CHLORIDE 0.9 % IV SOLN
Freq: Once | INTRAVENOUS | Status: AC
  Filled 2022-05-15: qty 250

## 2022-05-15 NOTE — Patient Instructions (Signed)

## 2022-05-21 ENCOUNTER — Inpatient Hospital Stay: Payer: BC Managed Care – PPO

## 2022-05-21 VITALS — BP 117/74 | HR 74 | Temp 98.0°F | Resp 17

## 2022-05-21 DIAGNOSIS — E559 Vitamin D deficiency, unspecified: Secondary | ICD-10-CM | POA: Diagnosis not present

## 2022-05-21 DIAGNOSIS — D508 Other iron deficiency anemias: Secondary | ICD-10-CM

## 2022-05-21 DIAGNOSIS — Z79899 Other long term (current) drug therapy: Secondary | ICD-10-CM | POA: Diagnosis not present

## 2022-05-21 DIAGNOSIS — D509 Iron deficiency anemia, unspecified: Secondary | ICD-10-CM | POA: Diagnosis not present

## 2022-05-21 DIAGNOSIS — M4105 Infantile idiopathic scoliosis, thoracolumbar region: Secondary | ICD-10-CM | POA: Diagnosis not present

## 2022-05-21 MED ORDER — SODIUM CHLORIDE 0.9 % IV SOLN
200.0000 mg | INTRAVENOUS | Status: DC
  Administered 2022-05-21: 200 mg via INTRAVENOUS
  Filled 2022-05-21: qty 200

## 2022-05-21 MED ORDER — SODIUM CHLORIDE 0.9 % IV SOLN
Freq: Once | INTRAVENOUS | Status: AC
  Filled 2022-05-21: qty 250

## 2022-05-29 ENCOUNTER — Inpatient Hospital Stay: Payer: BC Managed Care – PPO

## 2022-05-29 ENCOUNTER — Other Ambulatory Visit: Payer: Self-pay | Admitting: Medical Oncology

## 2022-05-29 VITALS — BP 109/72 | HR 75 | Temp 97.6°F | Resp 16

## 2022-05-29 DIAGNOSIS — D508 Other iron deficiency anemias: Secondary | ICD-10-CM

## 2022-05-29 DIAGNOSIS — E559 Vitamin D deficiency, unspecified: Secondary | ICD-10-CM | POA: Diagnosis not present

## 2022-05-29 DIAGNOSIS — M4105 Infantile idiopathic scoliosis, thoracolumbar region: Secondary | ICD-10-CM | POA: Diagnosis not present

## 2022-05-29 DIAGNOSIS — Z79899 Other long term (current) drug therapy: Secondary | ICD-10-CM | POA: Diagnosis not present

## 2022-05-29 DIAGNOSIS — D509 Iron deficiency anemia, unspecified: Secondary | ICD-10-CM | POA: Diagnosis not present

## 2022-05-29 MED ORDER — SODIUM CHLORIDE 0.9 % IV SOLN
Freq: Once | INTRAVENOUS | Status: AC
  Filled 2022-05-29: qty 250

## 2022-05-29 MED ORDER — SODIUM CHLORIDE 0.9 % IV SOLN
200.0000 mg | Freq: Once | INTRAVENOUS | Status: AC
  Administered 2022-05-29: 200 mg via INTRAVENOUS
  Filled 2022-05-29: qty 200

## 2022-05-29 NOTE — Patient Instructions (Signed)

## 2022-06-04 MED FILL — Iron Sucrose Inj 20 MG/ML (Fe Equiv): INTRAVENOUS | Qty: 10 | Status: AC

## 2022-06-05 ENCOUNTER — Inpatient Hospital Stay: Payer: BC Managed Care – PPO | Attending: Oncology

## 2022-06-05 ENCOUNTER — Inpatient Hospital Stay: Payer: BC Managed Care – PPO

## 2022-06-05 VITALS — BP 108/74 | HR 82 | Temp 99.7°F | Resp 18

## 2022-06-05 DIAGNOSIS — Z79899 Other long term (current) drug therapy: Secondary | ICD-10-CM | POA: Insufficient documentation

## 2022-06-05 DIAGNOSIS — D509 Iron deficiency anemia, unspecified: Secondary | ICD-10-CM | POA: Diagnosis not present

## 2022-06-05 DIAGNOSIS — D508 Other iron deficiency anemias: Secondary | ICD-10-CM

## 2022-06-05 MED ORDER — SODIUM CHLORIDE 0.9 % IV SOLN
Freq: Once | INTRAVENOUS | Status: AC
  Filled 2022-06-05: qty 250

## 2022-06-05 MED ORDER — SODIUM CHLORIDE 0.9 % IV SOLN
200.0000 mg | Freq: Once | INTRAVENOUS | Status: AC
  Administered 2022-06-05: 200 mg via INTRAVENOUS
  Filled 2022-06-05: qty 200

## 2022-06-05 NOTE — Patient Instructions (Signed)

## 2022-06-23 ENCOUNTER — Encounter: Payer: Self-pay | Admitting: Family Medicine

## 2022-06-23 MED ORDER — MOLNUPIRAVIR EUA 200MG CAPSULE: 4.0000 | ORAL_CAPSULE | Freq: Two times a day (BID) | ORAL | 0 refills | Status: AC

## 2022-07-10 ENCOUNTER — Inpatient Hospital Stay: Payer: BC Managed Care – PPO | Attending: Oncology

## 2022-07-10 ENCOUNTER — Other Ambulatory Visit: Payer: Self-pay | Admitting: *Deleted

## 2022-07-10 DIAGNOSIS — D509 Iron deficiency anemia, unspecified: Secondary | ICD-10-CM | POA: Diagnosis not present

## 2022-07-10 DIAGNOSIS — Z79899 Other long term (current) drug therapy: Secondary | ICD-10-CM | POA: Diagnosis not present

## 2022-07-10 LAB — CBC
HCT: 37.9 % (ref 36.0–46.0)
Hemoglobin: 12.2 g/dL (ref 12.0–15.0)
MCH: 26.9 pg (ref 26.0–34.0)
MCHC: 32.2 g/dL (ref 30.0–36.0)
MCV: 83.7 fL (ref 80.0–100.0)
Platelets: 341 10*3/uL (ref 150–400)
RBC: 4.53 MIL/uL (ref 3.87–5.11)
RDW: 16.6 % — ABNORMAL HIGH (ref 11.5–15.5)
WBC: 7.1 10*3/uL (ref 4.0–10.5)
nRBC: 0 % (ref 0.0–0.2)

## 2022-07-10 LAB — IRON AND TIBC
Iron: 81 ug/dL (ref 28–170)
Saturation Ratios: 27 % (ref 10.4–31.8)
TIBC: 304 ug/dL (ref 250–450)
UIBC: 223 ug/dL

## 2022-07-10 LAB — FOLATE: Folate: 7.9 ng/mL (ref >5.9)

## 2022-07-10 LAB — FERRITIN: Ferritin: 78 ng/mL (ref 11–307)

## 2022-07-10 LAB — VITAMIN B12: Vitamin B-12: 249 pg/mL (ref 180–914)

## 2022-07-12 LAB — CELIAC DISEASE PANEL
Endomysial Ab, IgA: NEGATIVE
IgA: 223 mg/dL (ref 87–352)
Tissue Transglutaminase Ab, IgA: <2 U/mL (ref 0–3)

## 2022-07-13 ENCOUNTER — Inpatient Hospital Stay (HOSPITAL_BASED_OUTPATIENT_CLINIC_OR_DEPARTMENT_OTHER): Payer: BC Managed Care – PPO | Admitting: Oncology

## 2022-07-13 ENCOUNTER — Encounter: Payer: Self-pay | Admitting: Oncology

## 2022-07-13 DIAGNOSIS — D509 Iron deficiency anemia, unspecified: Secondary | ICD-10-CM | POA: Diagnosis not present

## 2022-07-13 DIAGNOSIS — E538 Deficiency of other specified B group vitamins: Secondary | ICD-10-CM

## 2022-07-13 NOTE — Progress Notes (Signed)
I connected with Diane Curry on 07/13/22 at  3:00 PM EST by video enabled telemedicine visit and verified that I am speaking with the correct person using two identifiers.   I discussed the limitations, risks, security and privacy concerns of performing an evaluation and management service by telemedicine and the availability of in-person appointments. I also discussed with the patient that there may be a patient responsible charge related to this service. The patient expressed understanding and agreed to proceed.  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  Most recent CBC from 04/16/2022 showed white count of 5.4, H&H of 9.7/31.9 with an MCV of 78 and a platelet count of 440.  Iron studies showed ferritin level of 5 with an iron saturation of 8%.  Looking back at her prior CBCs her hemoglobin has been around 10 with chronic microcytosis at least dating back to 2018.   Patient reports  having long-term anemia ever since her menarche.  Her menstrual cycle last for 5 days and first couple of days can be particularly heavy.  She denies any bleeding in her stool or urine.  She has not had a colonoscopy but has been doing stool tests.  Denies any consistent use of NSAIDs.  Interval history patient is doing well presently.  She has elected to proceed with FIT testing for colonoscopy screening   Review of Systems  Constitutional:  Negative for chills, fever, malaise/fatigue and weight loss.  HENT:  Negative for congestion, ear discharge and nosebleeds.   Eyes:  Negative for blurred vision.  Respiratory:  Negative for cough, hemoptysis, sputum production, shortness of breath and wheezing.   Cardiovascular:  Negative for chest pain, palpitations, orthopnea and claudication.   Gastrointestinal:  Negative for abdominal pain, blood in stool, constipation, diarrhea, heartburn, melena, nausea and vomiting.  Genitourinary:  Negative for dysuria, flank pain, frequency, hematuria and urgency.  Musculoskeletal:  Negative for back pain, joint pain and myalgias.  Skin:  Negative for rash.  Neurological:  Negative for dizziness, tingling, focal weakness, seizures, weakness and headaches.  Endo/Heme/Allergies:  Does not bruise/bleed easily.  Psychiatric/Behavioral:  Negative for depression and suicidal ideas. The patient does not have insomnia.     No Known Allergies  Past Medical History:  Diagnosis Date   Anemia    Menstrual periods irregular    Vitamin D deficiency     Past Surgical History:  Procedure Laterality Date   DILATION AND CURETTAGE OF UTERUS  2013   SAB at 10-12 weeks, warranted PRBC and admission overnight. 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  Tobacco Use   Smoking status: Never   Smokeless tobacco: Never  Vaping Use   Vaping Use: Never used  Substance and Sexual Activity   Alcohol use: Yes    Comment: Once per month; wine or mixed  drinks   Drug use: No   Sexual activity: Yes  Other Topics Concern   Not on file  Social History Narrative   Not on file   Social Determinants of Health   Financial Resource Strain: Not on file  Food Insecurity: Not on file  Transportation Needs: Not on file  Physical Activity: Not on file  Stress: Not on file  Social Connections: Not on file  Intimate Partner Violence: Not on file    Family History  Problem Relation  Age of Onset   Osteoporosis Mother    Diabetes Father        non- insulin Dependent   Hypertension Father    Other Sister        produces "extra CSF" that causes visual abnormalities   Hypertension Maternal Grandfather    Breast cancer Paternal Grandmother 74   Stroke Paternal Grandfather    Hypertension Paternal Grandfather    Colon cancer Neg Hx    Ovarian cancer Neg Hx    Cervical cancer Neg Hx      Current Outpatient Medications:    acetaminophen (TYLENOL) 325 MG tablet, Take 650 mg by mouth every 6 (six) hours as needed., Disp: , Rfl:    FERROUS GLUCONATE PO, Take by mouth., Disp: , Rfl:    ibuprofen (ADVIL,MOTRIN) 200 MG tablet, Take 200 mg by mouth every 6 (six) hours as needed., Disp: , Rfl:    loratadine (CLARITIN) 10 MG tablet, Take 10 mg by mouth daily., Disp: , Rfl:    metaxalone (SKELAXIN) 800 MG tablet, Take 1 tablet (800 mg total) by mouth 3 (three) times daily as needed for muscle spasms., Disp: 30 tablet, Rfl: 1   Multiple Vitamins-Minerals (WOMENS MULTIVITAMIN PO), Take by mouth., Disp: , Rfl:    SUMAtriptan (IMITREX) 100 MG tablet, Take 1 tablet (100 mg total) by mouth once as needed for up to 1 dose. May repeat in 2 hours if headache persists or recurs., Disp: 10 tablet, Rfl: 11  No results found.  No images are attached to the encounter.      Latest Ref Rng & Units 04/16/2022    7:52 AM  CMP  Glucose 70 - 99 mg/dL 95   BUN 6 - 24 mg/dL 10   Creatinine 0.57 - 1.00 mg/dL 0.72   Sodium 134 - 144 mmol/L 139   Potassium 3.5 - 5.2 mmol/L 4.3   Chloride 96 - 106 mmol/L 103   CO2 20 - 29 mmol/L 22   Calcium 8.7 - 10.2 mg/dL 9.0   Total Protein 6.0 - 8.5 g/dL 6.4   Total Bilirubin 0.0 - 1.2 mg/dL 0.4   Alkaline Phos 44 - 121 IU/L 52   AST 0 - 40 IU/L 12   ALT 0 - 32 IU/L 13       Latest Ref Rng & Units 07/10/2022    3:05 PM  CBC  WBC 4.0 - 10.5 K/uL 7.1   Hemoglobin 12.0 - 15.0 g/dL 12.2   Hematocrit 36.0 - 46.0 % 37.9   Platelets 150 - 400 K/uL 341       Observation/objective: Appears in no acute distress over video visit today.  Breathing is nonlabored  Assessment and plan: Patient is a 47 year old female and this is a routine follow-up visit for iron deficiency anemia  Patient received 5 doses of Venofer in [place].  Her hemoglobin has improved from 9.7-12.2 presently.  Ferritin levels are normal at 78.  Iron studies are also normal.  Folate is  normal although B12 is mildly low at 249.  I have asked her to take oral B12 1000 mcg daily.  I will repeat CBC ferritin and iron studies and B12 levels in 3 months in 6 months and see her back in 6 months.  With regards to colonoscopy screening it would be okay to get FIT testing done but I did counsel the patient that stool tests usually have low sensitivity to pick up precancerous lesions as compared to colonoscopy.  Follow-up instructions: As above  I discussed the assessment and treatment plan with the patient. The patient was provided an opportunity to ask questions and all were answered. The patient agreed with the plan and demonstrated an understanding of the instructions.   The patient was advised to call back or seek an in-person evaluation if the symptoms worsen or if the condition fails to improve as anticipated.  I provided 12 minutes of face-to-face video visit time during this encounter.  Time spent in reviewing labs and coordinating future appointments with the patient  Visit Diagnosis: 1. Iron deficiency anemia, unspecified iron deficiency anemia type   2. B12 deficiency     Dr. Randa Evens, MD, MPH [facility] at [facility] Tel- [phone] 07/13/2022 6:49 PM

## 2022-07-17 ENCOUNTER — Encounter: Payer: Self-pay | Admitting: Family Medicine

## 2022-08-21 ENCOUNTER — Other Ambulatory Visit: Payer: Self-pay

## 2022-08-21 ENCOUNTER — Inpatient Hospital Stay: Payer: BC Managed Care – PPO | Attending: Oncology

## 2022-08-21 DIAGNOSIS — D509 Iron deficiency anemia, unspecified: Secondary | ICD-10-CM | POA: Insufficient documentation

## 2022-08-21 DIAGNOSIS — E538 Deficiency of other specified B group vitamins: Secondary | ICD-10-CM | POA: Insufficient documentation

## 2022-08-23 LAB — H. PYLORI ANTIGEN, STOOL: H. Pylori Stool Ag, Eia: POSITIVE — AB

## 2022-08-24 ENCOUNTER — Telehealth: Payer: Self-pay | Admitting: *Deleted

## 2022-08-24 ENCOUNTER — Other Ambulatory Visit: Payer: Self-pay | Admitting: *Deleted

## 2022-08-24 MED ORDER — METRONIDAZOLE 500 MG PO TABS: 500.0000 mg | ORAL_TABLET | Freq: Three times a day (TID) | ORAL | 0 refills | Status: DC

## 2022-08-24 MED ORDER — AMOXICILLIN 500 MG PO TABS: 500.0000 mg | ORAL_TABLET | Freq: Two times a day (BID) | ORAL | 0 refills | Status: DC

## 2022-08-24 MED ORDER — PANTOPRAZOLE SODIUM 20 MG PO TBEC: 20.0000 mg | DELAYED_RELEASE_TABLET | Freq: Two times a day (BID) | ORAL | 0 refills | Status: DC

## 2022-08-24 NOTE — Telephone Encounter (Signed)
I called the pt and got voicemail and let her know that she has H. Pylori infection. She needs to take 3 medications and I can send them to her pharmacy. I left her the message to call me back and we would talk about. Pt. Called and left me a message that she already looked at the results on my chart and her mother and father have it. She was looking at everything she can find on the internet. The pt. Said that there is a name rifabutin that is only 1 pill. I looked at this later tonight and it does say rifabutin but under it is still 3-4 meds for 14 days. I called pt back after I spoke to Janese Banks and she says she needs the drugs we sent in. Rifabutin is not what she needs. The pt. Was looking for small pills because she has swallowing problems with big pills but she will do it to get this over. She also wants if she needs another lab after the medication is over. I told her that I will check with MD tom. And let her know. She is ok with this

## 2022-10-12 ENCOUNTER — Other Ambulatory Visit: Payer: BC Managed Care – PPO

## 2022-10-13 ENCOUNTER — Other Ambulatory Visit: Payer: Self-pay | Admitting: Family Medicine

## 2022-10-13 DIAGNOSIS — Z1231 Encounter for screening mammogram for malignant neoplasm of breast: Secondary | ICD-10-CM

## 2022-10-16 ENCOUNTER — Inpatient Hospital Stay: Payer: BC Managed Care – PPO | Attending: Oncology

## 2022-10-16 ENCOUNTER — Inpatient Hospital Stay: Payer: BC Managed Care – PPO

## 2022-10-16 DIAGNOSIS — D509 Iron deficiency anemia, unspecified: Secondary | ICD-10-CM | POA: Insufficient documentation

## 2022-10-16 DIAGNOSIS — E538 Deficiency of other specified B group vitamins: Secondary | ICD-10-CM | POA: Insufficient documentation

## 2022-10-16 LAB — CBC
HCT: 36.8 % (ref 36.0–46.0)
Hemoglobin: 11.9 g/dL — ABNORMAL LOW (ref 12.0–15.0)
MCH: 27.8 pg (ref 26.0–34.0)
MCHC: 32.3 g/dL (ref 30.0–36.0)
MCV: 86 fL (ref 80.0–100.0)
Platelets: 328 10*3/uL (ref 150–400)
RBC: 4.28 MIL/uL (ref 3.87–5.11)
RDW: 13.3 % (ref 11.5–15.5)
WBC: 6.8 10*3/uL (ref 4.0–10.5)
nRBC: 0 % (ref 0.0–0.2)

## 2022-10-16 LAB — VITAMIN B12: Vitamin B-12: 1030 pg/mL — ABNORMAL HIGH (ref 180–914)

## 2022-10-16 LAB — IRON AND TIBC
Iron: 59 ug/dL (ref 28–170)
Saturation Ratios: 18 % (ref 10.4–31.8)
TIBC: 333 ug/dL (ref 250–450)
UIBC: 274 ug/dL

## 2022-10-16 LAB — FERRITIN: Ferritin: 18 ng/mL (ref 11–307)

## 2022-10-20 ENCOUNTER — Encounter: Payer: Self-pay | Admitting: *Deleted

## 2022-10-23 ENCOUNTER — Telehealth: Payer: Self-pay | Admitting: *Deleted

## 2022-10-23 NOTE — Telephone Encounter (Signed)
Called the pt. And had sent my chart and no answer so I called today and told her that Smith Robert says the iron levels are low and does she want IV iron and she does but she works in [employer] and there is a place that is infusion center and it [place] it [place] to the  or [place] and she will check with them as well as her insurance. She will call me back when she gets info because she does want the IV iron [place].

## 2022-11-03 ENCOUNTER — Ambulatory Visit
Admission: RE | Admit: 2022-11-03 | Discharge: 2022-11-03 | Disposition: A | Discharge status: Home / Self Care | Payer: BC Managed Care – PPO | Source: Ambulatory Visit | Attending: Family Medicine | Admitting: Family Medicine

## 2022-11-03 DIAGNOSIS — Z1231 Encounter for screening mammogram for malignant neoplasm of breast: Secondary | ICD-10-CM | POA: Diagnosis not present

## 2022-11-06 ENCOUNTER — Telehealth: Payer: Self-pay | Admitting: *Deleted

## 2022-11-06 NOTE — Telephone Encounter (Signed)
-----   Message from Creig Hines, MD sent at 10/16/2022 11:07 AM EDT ----- She has mild anemia no evidence of iron deficiency.  Does she want IV iron?

## 2022-11-06 NOTE — Telephone Encounter (Signed)
I called back today and I got her voicemail and left a message I know that the last time that we spoke she was trying to find a place in [place] that is where she works and she felt like she could find a out patient but did not have all the fees to do her iron infusions in the  future.  I asked her to give me a call or do a MyChart if she has had any information about it or if she wants to get it here done

## 2022-11-12 ENCOUNTER — Telehealth: Payer: Self-pay | Admitting: *Deleted

## 2022-11-12 ENCOUNTER — Other Ambulatory Visit: Payer: Self-pay | Admitting: Oncology

## 2022-11-12 NOTE — Telephone Encounter (Signed)
I called the pt. And let her know that Dr. Smith Robert got in touch with PA Georga Kaufmann and Nurse Leotis Shames batten and they will get infusion set up and patient will get a call from the [address] infusion area.  I did tell the patient that if she does not hear anything in like a week to give [place] a call back and we will work on it but everybody we talked to said that they would set it up and call you.  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

## 2022-11-13 ENCOUNTER — Telehealth: Payer: Self-pay

## 2022-11-13 NOTE — Telephone Encounter (Signed)
Auth Submission: NO AUTH NEEDED Site of care: Site of care: [facility] Payer: BCBS Medication & CPT/J Code(s) submitted: Venofer (Iron Sucrose) J1756 Route of submission (phone, fax, portal):  Phone # Fax # Auth type: Buy/Bill Units/visits requested: 5 Approval from: 11/13/2022 to 03/15/2023

## 2022-12-15 ENCOUNTER — Ambulatory Visit: Payer: BC Managed Care – PPO | Admitting: *Deleted

## 2022-12-15 VITALS — BP 120/78 | HR 83 | Temp 98.9°F | Resp 20 | Ht 64.0 in | Wt 162.8 lb

## 2022-12-15 DIAGNOSIS — D508 Other iron deficiency anemias: Secondary | ICD-10-CM

## 2022-12-15 DIAGNOSIS — D509 Iron deficiency anemia, unspecified: Secondary | ICD-10-CM | POA: Diagnosis not present

## 2022-12-15 MED ORDER — ACETAMINOPHEN 325 MG PO TABS: 650.0000 mg | ORAL_TABLET | Freq: Once | ORAL | Status: DC

## 2022-12-15 MED ORDER — DIPHENHYDRAMINE HCL 25 MG PO CAPS: 25.0000 mg | ORAL_CAPSULE | Freq: Once | ORAL | Status: DC

## 2022-12-15 MED ORDER — SODIUM CHLORIDE 0.9 % IV SOLN
200.0000 mg | INTRAVENOUS | Status: DC
  Administered 2022-12-15: 200 mg via INTRAVENOUS
  Filled 2022-12-15: qty 10

## 2022-12-15 NOTE — Progress Notes (Signed)
Diagnosis: Iron Deficiency Anemia  Provider:  Chilton Greathouse MD  Procedure: IV Infusion  IV Type: Peripheral, IV Location: L Antecubital  Venofer (Iron Sucrose), Dose: 200 mg  Infusion Start Time: 0936 am  Infusion Stop Time: 0953 am  Post Infusion IV Care: Observation period completed and Peripheral IV Discontinued  Discharge: Condition: Good, Destination: Home . AVS Declined  Performed by:  Forrest Moron, RN

## 2022-12-17 ENCOUNTER — Ambulatory Visit (INDEPENDENT_AMBULATORY_CARE_PROVIDER_SITE_OTHER): Payer: BC Managed Care – PPO

## 2022-12-17 VITALS — BP 122/85 | HR 69 | Temp 99.1°F | Resp 20 | Ht 64.0 in | Wt 163.0 lb

## 2022-12-17 DIAGNOSIS — D509 Iron deficiency anemia, unspecified: Secondary | ICD-10-CM

## 2022-12-17 DIAGNOSIS — D508 Other iron deficiency anemias: Secondary | ICD-10-CM

## 2022-12-17 MED ORDER — SODIUM CHLORIDE 0.9 % IV SOLN
200.0000 mg | INTRAVENOUS | Status: DC
  Administered 2022-12-17: 200 mg via INTRAVENOUS
  Filled 2022-12-17: qty 10

## 2022-12-17 MED ORDER — ACETAMINOPHEN 325 MG PO TABS: 650.0000 mg | ORAL_TABLET | Freq: Once | ORAL | Status: DC

## 2022-12-17 MED ORDER — DIPHENHYDRAMINE HCL 25 MG PO CAPS: 25.0000 mg | ORAL_CAPSULE | Freq: Once | ORAL | Status: DC

## 2022-12-17 NOTE — Progress Notes (Signed)
Diagnosis: Iron Deficiency Anemia  Provider:  Chilton Greathouse MD  Procedure: IV Infusion  IV Type: Peripheral, IV Location: L Antecubital  Venofer (Iron Sucrose), Dose: 200 mg  Infusion Start Time: 0933  Infusion Stop Time: 0952  Post Infusion IV Care: Observation period completed and Peripheral IV Discontinued  Discharge: Condition: Stable, Destination: Home . AVS Declined  Performed by:  Wyvonne Lenz, RN

## 2022-12-22 ENCOUNTER — Ambulatory Visit: Payer: BC Managed Care – PPO

## 2022-12-22 MED ORDER — DIPHENHYDRAMINE HCL 25 MG PO CAPS: 25.0000 mg | ORAL_CAPSULE | Freq: Once | ORAL | Status: AC

## 2022-12-22 MED ORDER — ACETAMINOPHEN 325 MG PO TABS: 650.0000 mg | ORAL_TABLET | Freq: Once | ORAL | Status: AC

## 2022-12-22 MED ORDER — SODIUM CHLORIDE 0.9 % IV SOLN
200.0000 mg | INTRAVENOUS | Status: DC
  Filled 2022-12-22: qty 10

## 2022-12-24 ENCOUNTER — Ambulatory Visit (INDEPENDENT_AMBULATORY_CARE_PROVIDER_SITE_OTHER): Payer: BC Managed Care – PPO

## 2022-12-24 VITALS — BP 119/82 | HR 83 | Temp 98.0°F | Resp 16 | Ht 64.0 in | Wt 160.8 lb

## 2022-12-24 DIAGNOSIS — D508 Other iron deficiency anemias: Secondary | ICD-10-CM

## 2022-12-24 DIAGNOSIS — D509 Iron deficiency anemia, unspecified: Secondary | ICD-10-CM | POA: Diagnosis not present

## 2022-12-24 MED ORDER — ACETAMINOPHEN 325 MG PO TABS: 650.0000 mg | ORAL_TABLET | Freq: Once | ORAL | Status: DC

## 2022-12-24 MED ORDER — DIPHENHYDRAMINE HCL 25 MG PO CAPS: 25.0000 mg | ORAL_CAPSULE | Freq: Once | ORAL | Status: DC

## 2022-12-24 MED ORDER — SODIUM CHLORIDE 0.9 % IV SOLN
200.0000 mg | INTRAVENOUS | Status: DC
  Administered 2022-12-24: 200 mg via INTRAVENOUS
  Filled 2022-12-24: qty 10

## 2022-12-24 NOTE — Progress Notes (Signed)
Diagnosis: Iron Deficiency Anemia  Provider:  Chilton Greathouse MD  Procedure: IV Infusion  IV Type: Peripheral, IV Location: L Antecubital  Venofer (Iron Sucrose), Dose: 200 mg  Infusion Start Time: 0932  Infusion Stop Time: 0949  Post Infusion IV Care: Patient declined observation and Peripheral IV Discontinued  Discharge: Condition: Good, Destination: Home . AVS Declined  Performed by:  Adriana Mccallum, RN

## 2022-12-29 ENCOUNTER — Ambulatory Visit (INDEPENDENT_AMBULATORY_CARE_PROVIDER_SITE_OTHER): Payer: BC Managed Care – PPO

## 2022-12-29 VITALS — BP 123/84 | HR 75 | Temp 98.6°F | Resp 18 | Ht 64.0 in | Wt 164.4 lb

## 2022-12-29 DIAGNOSIS — D508 Other iron deficiency anemias: Secondary | ICD-10-CM

## 2022-12-29 DIAGNOSIS — D509 Iron deficiency anemia, unspecified: Secondary | ICD-10-CM

## 2022-12-29 MED ORDER — SODIUM CHLORIDE 0.9 % IV SOLN
200.0000 mg | INTRAVENOUS | Status: DC
  Administered 2022-12-29: 200 mg via INTRAVENOUS
  Filled 2022-12-29: qty 10

## 2022-12-29 MED ORDER — DIPHENHYDRAMINE HCL 25 MG PO CAPS: 25.0000 mg | ORAL_CAPSULE | Freq: Once | ORAL | Status: DC

## 2022-12-29 MED ORDER — ACETAMINOPHEN 325 MG PO TABS: 650.0000 mg | ORAL_TABLET | Freq: Once | ORAL | Status: DC

## 2022-12-29 NOTE — Progress Notes (Signed)
Diagnosis: Iron Deficiency Anemia  Provider:  Chilton Greathouse MD  Procedure: IV Infusion  IV Type: Peripheral, IV Location: L Antecubital  Venofer (Iron Sucrose), Dose: 200 mg  Infusion Start Time: 0924  Infusion Stop Time: 0949  Post Infusion IV Care: Patient declined observation and Peripheral IV Discontinued  Discharge: Condition: Good, Destination: Home . AVS Declined  Performed by:  Garnette Czech, RN

## 2022-12-31 ENCOUNTER — Ambulatory Visit: Payer: BC Managed Care – PPO

## 2023-01-05 ENCOUNTER — Ambulatory Visit (INDEPENDENT_AMBULATORY_CARE_PROVIDER_SITE_OTHER): Payer: BC Managed Care – PPO

## 2023-01-05 VITALS — BP 121/80 | HR 69 | Temp 98.6°F | Resp 18 | Ht 63.0 in | Wt 161.0 lb

## 2023-01-05 DIAGNOSIS — D509 Iron deficiency anemia, unspecified: Secondary | ICD-10-CM

## 2023-01-05 DIAGNOSIS — D508 Other iron deficiency anemias: Secondary | ICD-10-CM

## 2023-01-05 MED ORDER — DIPHENHYDRAMINE HCL 25 MG PO CAPS: 25.0000 mg | ORAL_CAPSULE | Freq: Once | ORAL | Status: DC

## 2023-01-05 MED ORDER — ACETAMINOPHEN 325 MG PO TABS: 650.0000 mg | ORAL_TABLET | Freq: Once | ORAL | Status: DC

## 2023-01-05 MED ORDER — SODIUM CHLORIDE 0.9 % IV SOLN
200.0000 mg | Freq: Once | INTRAVENOUS | Status: AC
  Administered 2023-01-05: 200 mg via INTRAVENOUS
  Filled 2023-01-05: qty 10

## 2023-01-05 NOTE — Progress Notes (Signed)
Diagnosis: Iron Deficiency Anemia  Provider:  Chilton Greathouse MD  Procedure: IV Infusion  IV Type: Peripheral, IV Location: L Antecubital  Venofer (Iron Sucrose), Dose: 200 mg  Infusion Start Time: 1054  Infusion Stop Time: 1112  Post Infusion IV Care: Patient declined observation and Peripheral IV Discontinued  Discharge: Condition: Good, Destination: Home . AVS Declined  Performed by:  Garnette Czech, RN

## 2023-01-15 ENCOUNTER — Inpatient Hospital Stay: Payer: BC Managed Care – PPO | Attending: Oncology

## 2023-01-15 ENCOUNTER — Encounter: Payer: Self-pay | Admitting: Oncology

## 2023-01-15 ENCOUNTER — Inpatient Hospital Stay: Payer: BC Managed Care – PPO | Admitting: Oncology

## 2023-01-15 VITALS — BP 127/90 | HR 67 | Temp 98.9°F | Resp 18 | Ht 63.0 in | Wt 160.3 lb

## 2023-01-15 DIAGNOSIS — Z803 Family history of malignant neoplasm of breast: Secondary | ICD-10-CM | POA: Insufficient documentation

## 2023-01-15 DIAGNOSIS — D509 Iron deficiency anemia, unspecified: Secondary | ICD-10-CM | POA: Diagnosis not present

## 2023-01-15 DIAGNOSIS — E538 Deficiency of other specified B group vitamins: Secondary | ICD-10-CM | POA: Diagnosis not present

## 2023-01-15 LAB — CBC
HCT: 35.8 % — ABNORMAL LOW (ref 36.0–46.0)
Hemoglobin: 11.5 g/dL — ABNORMAL LOW (ref 12.0–15.0)
MCH: 28 pg (ref 26.0–34.0)
MCHC: 32.1 g/dL (ref 30.0–36.0)
MCV: 87.1 fL (ref 80.0–100.0)
Platelets: 306 10*3/uL (ref 150–400)
RBC: 4.11 MIL/uL (ref 3.87–5.11)
RDW: 14.2 % (ref 11.5–15.5)
WBC: 5.6 10*3/uL (ref 4.0–10.5)
nRBC: 0 % (ref 0.0–0.2)

## 2023-01-15 LAB — FERRITIN: Ferritin: 193 ng/mL (ref 11–307)

## 2023-01-15 LAB — IRON AND TIBC
Iron: 100 ug/dL (ref 28–170)
Saturation Ratios: 36 % — ABNORMAL HIGH (ref 10.4–31.8)
TIBC: 280 ug/dL (ref 250–450)
UIBC: 180 ug/dL

## 2023-01-15 LAB — VITAMIN B12: Vitamin B-12: 1236 pg/mL — ABNORMAL HIGH (ref 180–914)

## 2023-01-15 NOTE — Progress Notes (Signed)
Hematology/Oncology Consult note [facility]  Telephone:([phone] [phone]  Patient Care Team: Erasmo Downer, MD as PCP - General (Family Medicine) Creig Hines, MD as Consulting Physician (Oncology)   Name of the patient: Diane Curry  [id number]  [birth year]   Date of visit: 01/15/23  Diagnosis-iron deficiency anemia  Chief complaint/ Reason for visit-routine follow-up of iron deficiency anemia  Heme/Onc history: Patient is a 47 year old female referred for iron deficiency anemia.  Most recent CBC from 04/16/2022 showed white count of 5.4, H&H of 9.7/31.9 with an MCV of 78 and a platelet count of 440.  Iron studies showed ferritin level of 5 with an iron saturation of 8%.  Looking back at her prior CBCs her hemoglobin has been around 10 with chronic microcytosis at least dating back to 2018.   Patient reports  having long-term anemia ever since her menarche.  Her menstrual cycle last for 5 days and first couple of days can be particularly heavy.  She denies any bleeding in her stool or urine.  She has not had a colonoscopy but has been doing stool tests.  Denies any consistent use of NSAIDs.  She gets IV iron through [employer].  Interval history-due to delays in getting insurance sorted out patient received her IV iron about 2 weeks ago.  She will be seeing GYN soon to discuss endometrial ablation.  Menstrual cycles continue to be heavy.  Denies any blood loss in her stool  ECOG PS- 0 Pain scale- 0   Review of systems- Review of Systems  Constitutional:  Negative for chills, fever, malaise/fatigue and weight loss.  HENT:  Negative for congestion, ear discharge and nosebleeds.   Eyes:  Negative for blurred vision.  Respiratory:  Negative for cough, hemoptysis, sputum production, shortness of breath and wheezing.   Cardiovascular:  Negative for chest pain, palpitations, orthopnea and claudication.  Gastrointestinal:   Negative for abdominal pain, blood in stool, constipation, diarrhea, heartburn, melena, nausea and vomiting.  Genitourinary:  Negative for dysuria, flank pain, frequency, hematuria and urgency.  Musculoskeletal:  Negative for back pain, joint pain and myalgias.  Skin:  Negative for rash.  Neurological:  Negative for dizziness, tingling, focal weakness, seizures, weakness and headaches.  Endo/Heme/Allergies:  Does not bruise/bleed easily.  Psychiatric/Behavioral:  Negative for depression and suicidal ideas. The patient does not have insomnia.       No Known Allergies   Past Medical History:  Diagnosis Date   Anemia    Menstrual periods irregular    Vitamin D deficiency      Past Surgical History:  Procedure Laterality Date   DILATION AND CURETTAGE OF UTERUS  2013   SAB at 10-12 weeks, warranted PRBC and admission overnight. 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  Tobacco Use   Smoking status: Never   Smokeless tobacco: Never  Vaping Use   Vaping status: Never Used  Substance and Sexual Activity   Alcohol use: Yes    Comment: Once per month;  wine or mixed drinks   Drug use: No   Sexual activity: Yes  Other Topics Concern   Not on file  Social History Narrative   Not on file   Social Determinants of Health   Financial Resource Strain: Not on file  Food Insecurity: Not on file  Transportation Needs: Not on file  Physical Activity: Not on file  Stress: Not on file  Social Connections: Not on file  Intimate Partner Violence: Not on file    Family History  Problem Relation Age of  Onset   Osteoporosis Mother    Diabetes Father        non- insulin Dependent   Hypertension Father    Other Sister        produces "extra CSF" that causes visual abnormalities   Hypertension Maternal Grandfather    Breast cancer Paternal Grandmother 33   Stroke Paternal Grandfather    Hypertension Paternal Grandfather    Colon cancer Neg Hx    Ovarian cancer Neg Hx    Cervical cancer Neg Hx      Current Outpatient Medications:    amoxicillin (AMOXIL) 500 MG tablet, Take 1 tablet (500 mg total) by mouth 2 (two) times daily., Disp: 28 tablet, Rfl: 0   metroNIDAZOLE (FLAGYL) 500 MG tablet, Take 1 tablet (500 mg total) by mouth 3 (three) times daily., Disp: 42 tablet, Rfl: 0   pantoprazole (PROTONIX) 20 MG tablet, Take 1 tablet (20 mg total) by mouth 2 (two) times daily., Disp: 28 tablet, Rfl: 0   acetaminophen (TYLENOL) 325 MG tablet, Take 650 mg by mouth every 6 (six) hours as needed., Disp: , Rfl:    FERROUS GLUCONATE PO, Take by mouth., Disp: , Rfl:    ibuprofen (ADVIL,MOTRIN) 200 MG tablet, Take 200 mg by mouth every 6 (six) hours as needed., Disp: , Rfl:    loratadine (CLARITIN) 10 MG tablet, Take 10 mg by mouth daily., Disp: , Rfl:    metaxalone (SKELAXIN) 800 MG tablet, Take 1 tablet (800 mg total) by mouth 3 (three) times daily as needed for muscle spasms., Disp: 30 tablet, Rfl: 1   Multiple Vitamins-Minerals (WOMENS MULTIVITAMIN PO), Take by mouth., Disp: , Rfl:    SUMAtriptan (IMITREX) 100 MG tablet, Take 1 tablet (100 mg total) by mouth once as needed for up to 1 dose. May repeat in 2 hours if headache persists or recurs., Disp: 10 tablet, Rfl: 11 No current facility-administered medications for this visit.  Facility-Administered Medications Ordered in Other Visits:    acetaminophen (TYLENOL) tablet 650 mg, 650 mg, Oral, Once, Creig Hines, MD   diphenhydrAMINE (BENADRYL) capsule 25 mg, 25 mg, Oral, Once, Creig Hines, MD  Physical exam: There were no vitals filed for  this visit. Physical Exam Cardiovascular:     Rate and Rhythm: Normal rate and regular rhythm.     Heart sounds: Normal heart sounds.  Pulmonary:     Effort: Pulmonary effort is normal.  Skin:    General: Skin is warm and dry.  Neurological:     Mental Status: She is alert and oriented to person, place, and time.         Latest Ref Rng & Units 04/16/2022    7:52 AM  CMP  Glucose 70 - 99 mg/dL 95   BUN 6 - 24 mg/dL 10   Creatinine 4.03 - 1.00 mg/dL 4.74   Sodium 259 - 563 mmol/L 139   Potassium 3.5 - 5.2 mmol/L 4.3   Chloride  96 - 106 mmol/L 103   CO2 20 - 29 mmol/L 22   Calcium 8.7 - 10.2 mg/dL 9.0   Total Protein 6.0 - 8.5 g/dL 6.4   Total Bilirubin 0.0 - 1.2 mg/dL 0.4   Alkaline Phos 44 - 121 IU/L 52   AST 0 - 40 IU/L 12   ALT 0 - 32 IU/L 13       Latest Ref Rng & Units 01/15/2023   12:40 PM  CBC  WBC 4.0 - 10.5 K/uL 5.6   Hemoglobin 12.0 - 15.0 g/dL 16.1   Hematocrit 09.6 - 46.0 % 35.8   Platelets 150 - 400 K/uL 306      Assessment and plan- Patient is a 47 y.o. female here for routine follow-up of iron deficiency anemia  Patient is mildly anemic presently with a hemoglobin of 11.5.  Iron studies are still pending but given that she just received IV iron about 2 weeks ago which was delayed due to insurance issues it is unlikely that iron studies today would be possible to interpret.  I will repeat CBC ferritin and iron studies in 3 and 6 months and see her back in 6 months   Visit Diagnosis 1. Iron deficiency anemia, unspecified iron deficiency anemia type      Dr. Owens Shark, MD, MPH [facility] [phone] 01/15/2023 1:29 PM

## 2023-01-15 NOTE — Addendum Note (Signed)
Addended by: Corene Cornea on: 01/15/2023 02:55 PM   Modules accepted: Orders

## 2023-03-18 ENCOUNTER — Other Ambulatory Visit: Payer: Self-pay

## 2023-03-18 ENCOUNTER — Ambulatory Visit: Payer: BC Managed Care – PPO | Admitting: Family Medicine

## 2023-03-18 VITALS — BP 128/82 | HR 94 | Ht 63.0 in | Wt 164.0 lb

## 2023-03-18 DIAGNOSIS — M7712 Lateral epicondylitis, left elbow: Secondary | ICD-10-CM | POA: Diagnosis not present

## 2023-03-18 DIAGNOSIS — M25522 Pain in left elbow: Secondary | ICD-10-CM

## 2023-03-18 MED ORDER — MELOXICAM 15 MG PO TABS: ORAL_TABLET | ORAL | 3 refills | Status: DC

## 2023-03-18 NOTE — Progress Notes (Signed)
   Rubin Payor, PhD, LAT, ATC acting as a scribe for Clementeen Graham, MD.  Diane Curry is a 47 y.o. female who presents to [facility] today for L elbow pain ongoing since mid-Sept. She is RHD. She went to a used book sale and was carrying around bag of used book. Pt locates pain to the lateral aspect of her L elbow, but throb into her forearm and proximally. 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   Radiates: yes Paresthesia: no Grip strength: normal, but will have pain Aggravates: use- lifting, picking up things Treatments tried: compression sleeve, cubital tunnel brace  Pertinent review of systems: No fevers or chills  Relevant historical information: Iron deficiency anemia History of scoliosis status post fusion  Exam:  BP 128/82   Pulse 94   Ht 5\' 3"  (1.6 m)   Wt 164 lb (74.4 kg)   SpO2 96%   BMI 29.05 kg/m  General: Well Developed, well nourished, and in no acute distress.   MSK: Left elbow normal-appearing Normal motion. Intact strength extension and flexion. Tender palpation lateral epicondyle. Pain is present in the lateral elbow with resisted wrist extension.    Lab and Radiology Results  Diagnostic Limited MSK Ultrasound of: Left elbow lateral epicondyle No tear present in the common extensor tendon origin.  However there is some calcific change at the superficial portion of the tendon origin at the lateral epicondyle consistent with calcific tendinitis. Impression: Lateral epicondylitis     Assessment and Plan: 47 y.o. female with left lateral epicondylitis ongoing for about a month.  Symptoms are pretty significant rated severe at times.  We talked about options.  Will try some conservative management with oral diclofenac and Tylenol and home exercise program and referral to Occupational Therapy.  If not improving consider steroid injection.  Typically this will be done in a month or  6 weeks if not improved.   PDMP not reviewed this encounter. Orders Placed This Encounter  Procedures   [place] LIMITED JOINT SPACE STRUCTURES UP LEFT(NO LINKED CHARGES)    Order Specific Question:   Reason for Exam (SYMPTOM  OR DIAGNOSIS REQUIRED)    Answer:   left elbow pain    Order Specific Question:   Preferred imaging location?    Answer:   [facility] referral to Occupational Therapy    Referral Priority:   Routine    Referral Type:   Occupational Therapy    Referral Reason:   Specialty Services Required    Requested Specialty:   Occupational Therapy    Number of Visits Requested:   1   Meds ordered this encounter  Medications   meloxicam (MOBIC) 15 MG tablet    Sig: One tab PO qAM with breakfast for 2 weeks, then daily prn pain.    Dispense:  30 tablet    Refill:  3     Discussed warning signs or symptoms. Please see discharge instructions. Patient expresses understanding.   The above documentation has been reviewed and is accurate and complete Clementeen Graham, M.D.

## 2023-03-18 NOTE — Patient Instructions (Addendum)
Thank you for coming in today.   I've referred you to Occupational Therapy.  Let us know if you don't hear from them in one week.   Please complete the exercises that the athletic trainer went over with you:  View at [email or web address]-[email or web address] using code: [email or web address]  Use the meloxicam as needed daily.   Ok to take with tylneol arthritis and Skelaxin   If not improving next step could an injection.

## 2023-03-19 NOTE — Therapy (Signed)
OUTPATIENT OCCUPATIONAL THERAPY ORTHO EVALUATION  Patient Name: Diane Curry MRN: [id number] DOB:30-Sep-1975, 47 y.o., female Today's Date: 03/22/2023  PCP: Shirlee Latch, MD REFERRING PROVIDER: Rodolph Bong, MD   END OF SESSION:  OT End of Session - 03/22/23 1344     Visit Number 1    Number of Visits 7    Date for OT Re-Evaluation 05/07/23    Authorization Type BCBS    OT Start Time 1345    OT Stop Time 1433    OT Time Calculation (min) 48 min    Activity Tolerance Patient tolerated treatment well;No increased pain;Patient limited by fatigue;Patient limited by pain    Behavior During Therapy [facility] for tasks assessed/performed             Past Medical History:  Diagnosis Date   Anemia    Menstrual periods irregular    Vitamin D deficiency    Past Surgical History:  Procedure Laterality Date   DILATION AND CURETTAGE OF UTERUS  2013   SAB at 10-12 weeks, warranted PRBC and admission overnight. [place], [place]   harrington rod  1987   PILONIDAL CYST EXCISION  2000   Dr.Byrnette   TONSILLECTOMY  1992   Patient Active Problem List   Diagnosis Date Noted   Lateral epicondylitis, left elbow 03/18/2023   Iron deficiency anemia 05/01/2022   Infantile idiopathic scoliosis of thoracolumbar region 12/30/2018   Overweight 12/17/2017   Migraine 12/26/2016   Absolute anemia 09/03/2015   Avitaminosis D 07/30/2008   Irregular bleeding 07/21/2008   Allergic rhinitis 12/15/2001    ONSET DATE: approx Sept 2024  REFERRING DIAG: M25.522 (ICD-10-CM) - Left elbow pain   THERAPY DIAG:  Pain in left elbow  Muscle weakness (generalized)  Rationale for Evaluation and Treatment: Rehabilitation  SUBJECTIVE:   SUBJECTIVE STATEMENT: She states her elbow started hurting about a month ago after carrying many books home from a book sale.  She's tried ibprophen and tylenol and is feeling a bit better.  She does have a history of a spinal fusion.  She arrives wearing a  prefabricated wrist cock up brace today and also claims to have a counterforce brace which she is only been wearing at night and all night long and has been bothering her.  She works in hospice care, types a lot for work.     PERTINENT HISTORY: Pr MD notes: "left lateral epicondylitis ongoing for about a month. Symptoms are pretty significant rated severe at times. We talked about options. Will try some conservative management with oral diclofenac and Tylenol and home exercise program and referral to Occupational Therapy. If not improving consider steroid injection. Typically this will be done in a month or 6 weeks if not improved. "  PRECAUTIONS: past spinal fusion  RED FLAGS: None   WEIGHT BEARING RESTRICTIONS: No  PAIN:  Are you having pain? Yes: NPRS scale: 4/10 at rest and in past week at worst up to 8/10 Pain location: Lt lateral epicondyle Pain description: aching with motion Aggravating factors: motion Relieving factors: rest, heat  FALLS: Has patient fallen in last 6 months? No  LIVING ENVIRONMENT: Lives with: lives with their family   PLOF: Independent  PATIENT GOALS: To understand her symptoms and relieve her pain and problems from lateral epicondylitis    OBJECTIVE: (All objective assessments below are from initial evaluation on: 03/22/23 unless otherwise specified.)   HAND DOMINANCE: Right   ADLs: Overall ADLs: States decreased ability to grab, hold household objects, pain and  difficulty to open containers, perform FMS tasks (manipulate fasteners on clothing), mild to moderate bathing problems as well.    FUNCTIONAL OUTCOME MEASURES: Eval: Patient Specific Functional Scale: 4/10 (typing, driving, lift coffee cup)  (Higher Score  =  Better Ability for the Selected Tasks)      UPPER EXTREMITY ROM     Shoulder to Wrist AROM Right eval Left eval  Forearm supination  74  Forearm pronation   85  Wrist flexion 75 80  Wrist extension 70 70  (Blank rows = not  tested)   Hand AROM Left eval  Full Fist Ability (or Gap to Distal Palmar Crease) full  Thumb Opposition  (Kapandji Scale)  10  (Blank rows = not tested)   UPPER EXTREMITY MMT:     MMT Right TBD Left 03/22/23  Shoulder flexion    Shoulder abduction    Shoulder adduction    Shoulder extension    Shoulder internal rotation    Shoulder external rotation    Middle trapezius    Lower trapezius    Elbow flexion  4/5 tender  Elbow extension  4+/5  Forearm supination  4-/5 pain  Forearm pronation  4-/5 pain  Wrist flexion  5/5  Wrist extension  4-/5 tender  Wrist radial deviation    (Blank rows = not tested)  HAND FUNCTION: Eval: Observed weakness in affected Lt hand.  Grip strength Right: 64 lbs, Left: 48 lbs painful  COORDINATION: Eval: Observed coordination impairments with affected Lt hand. Box and Blocks Test: TBD Blocks today (TBD is WFL)  SENSATION: Eval:  Light touch intact today  EDEMA:   Eval: None   COGNITION: Eval: Overall cognitive status: WFL for evaluation today   OBSERVATIONS:   Eval: tender to Lt lat epi, tender resisted wrist ext test, tender in pronation, sup, and elbow ext- tennis elbow.    TODAY'S TREATMENT:  Post-evaluation treatment:   She was given self-care/safety recommendations to avoid palm down lifting, avoid repetitive gripping or prolonged postures at the elbow and the wrist.  OT educates her to wear her counterforce brace in the day with activities and educates her how to properly size it and fit and when she should take it off.  She was recommended to wear her prefabricated wrist cock up brace in the night loosely.  She can actually wear both in the day for ultimate support if she is having a lot of pain or performing stressful activities.  We discussed some of her habits like holding books to read for a long time and she should use a tabletop or her lap to help support the book versus holding it with outstretched arms for an extended  period of time.  Next, she was educated on body structures, functions and why this problem is happening to her.  She is also educated on exercises that she should do completely nonpainful he after a warm up (hot pack or self massage to the lateral epicondyle) for about 3 to 5 minutes.  They are listed below and each 1 was demonstrated for her and she performs back with little to no pain.     Exercises - Tricep Stretch- DO SEATED BY TABLE  - 3-4 x daily - 3-5 reps - 15 hold - Forearm Supination Stretch  - 3-4 x daily - 3-5 reps - 15 sec hold - Forearm Pronation Stretch  - 3-4 x daily - 3-5 reps - 15 sec hold - Wrist Flexion Stretch  - 4 x daily -  3-5 reps - 15 sec hold - Wrist Prayer Stretch  - 4 x daily - 3-5 reps - 15 sec hold - Standing Radial Nerve Glide  - 4-6 x daily - 1 sets - 10-15 reps (neuromuscular re-education for nerve tension)    PATIENT EDUCATION: Education details: See tx section above for details  Person educated: Patient Education method: Verbal Instruction, Teach back, Handouts  Education comprehension: States and demonstrates understanding, Additional Education required    HOME EXERCISE PROGRAM: Access Code: [email or web address] URL: [email or web address] Date: 03/22/2023 Prepared by: Fannie Knee   GOALS: Goals reviewed with patient? Yes   SHORT TERM GOALS: (STG required if POC>30 days) Target Date: 04/09/23  Pt will demo/state understanding of initial HEP to improve pain levels and prerequisite motion. Goal status: INITIAL   LONG TERM GOALS: Target Date: 05/07/23  Pt will improve functional ability by decreased impairment per PSFS assessment from 4 /10 to 8/10 or better, for better quality of life. Goal status: INITIAL  2.  Pt will improve grip strength in Lt hand from painful 48lbs to at least 55lbs for functional use at home and in IADLs. Goal status: INITIAL  3.  Pt will improve A/ROM in Lt FA supination AROM from 74 to at least 80, to have  functional motion for tasks like reach and grasp.  Goal status: INITIAL  4.  Pt will improve strength in Lt wrist ext from painful 4-/5 MMT to at least 4+/5 MMT to have increased functional ability to carry out selfcare and higher-level homecare tasks with less difficulty. Goal status: INITIAL  5.  Pt will decrease pain at worst from 8/10 to 3/10 or better to have better sleep and occupational participation in daily roles. Goal status: INITIAL  ASSESSMENT:  CLINICAL IMPRESSION: Patient is a 47 y.o. female who was seen today for occupational therapy evaluation for pain and decreased functional ability especially with the left elbow presenting as lateral epicondylitis.  She also seemed to have some slight radial nerve irritation perhaps from wearing counterforce brace incorrectly and all night long, and she has some triceps tightness which is typical.  She will benefit from outpatient occupational therapy to decrease negative symptoms and increase functional ability and overall quality of life.   PERFORMANCE DEFICITS: in functional skills including ADLs, IADLs, coordination, ROM, strength, pain, fascial restrictions, muscle spasms, flexibility, Gross motor control, body mechanics, endurance, decreased knowledge of precautions, and UE functional use, cognitive skills including problem solving and safety awareness, and psychosocial skills including coping strategies, environmental adaptation, and habits.   IMPAIRMENTS: are limiting patient from ADLs, IADLs, rest and sleep, work, and social participation.   COMORBIDITIES: may have co-morbidities  that affects occupational performance. Patient will benefit from skilled OT to address above impairments and improve overall function.  MODIFICATION OR ASSISTANCE TO COMPLETE EVALUATION: No modification of tasks or assist necessary to complete an evaluation.  OT OCCUPATIONAL PROFILE AND HISTORY: Problem focused assessment: Including review of records  relating to presenting problem.  CLINICAL DECISION MAKING: LOW - limited treatment options, no task modification necessary  REHAB POTENTIAL: Excellent  EVALUATION COMPLEXITY: Low      PLAN:  OT FREQUENCY: 1x/week  OT DURATION: 6 weeks through 05/07/23 as needed  PLANNED INTERVENTIONS: 97168 OT Re-evaluation, 97535 self care/ADL training, 24401 therapeutic exercise, 97530 therapeutic activity, 97112 neuromuscular re-education, 97140 manual therapy, 97035 ultrasound, 97039 fluidotherapy, 97010 moist heat, 97010 cryotherapy, 97033 iontophoresis, 97760 Orthotics management and training, 02725 Subsequent splinting/medication, compression bandaging, Dry needling, coping strategies training, and patient/family  education  RECOMMENDED OTHER SERVICES: None now  CONSULTED AND AGREED WITH PLAN OF CARE: Patient  PLAN FOR NEXT SESSION:   Review initial recommendations and home exercise program, also wear of prefabricated braces, consider dry needling modality if helpful or other manual therapy or modalities.   Fannie Knee, OTR/L,CHT 03/22/2023, 3:58 PM

## 2023-03-22 ENCOUNTER — Ambulatory Visit (INDEPENDENT_AMBULATORY_CARE_PROVIDER_SITE_OTHER): Payer: BC Managed Care – PPO | Admitting: Rehabilitative and Restorative Service Providers"

## 2023-03-22 ENCOUNTER — Encounter: Payer: Self-pay | Admitting: Rehabilitative and Restorative Service Providers"

## 2023-03-22 ENCOUNTER — Other Ambulatory Visit: Payer: Self-pay

## 2023-03-22 DIAGNOSIS — M25522 Pain in left elbow: Secondary | ICD-10-CM | POA: Diagnosis not present

## 2023-03-22 DIAGNOSIS — M6281 Muscle weakness (generalized): Secondary | ICD-10-CM | POA: Diagnosis not present

## 2023-03-25 NOTE — Therapy (Signed)
OUTPATIENT OCCUPATIONAL THERAPY TREATMENT NOTE  Patient Name: Diane Curry MRN: [id number] DOB:11/03/1975, 47 y.o., female Today's Date: 03/29/2023  PCP: Shirlee Latch, MD REFERRING PROVIDER: Rodolph Bong, MD   END OF SESSION:  OT End of Session - 03/29/23 1140     Visit Number 2    Number of Visits 7    Date for OT Re-Evaluation 05/07/23    Authorization Type BCBS    OT Start Time 1140    OT Stop Time 1231    OT Time Calculation (min) 51 min    Equipment Utilized During Treatment k-tape, [place] gel (for IASTM)    Activity Tolerance Patient tolerated treatment well;No increased pain;Patient limited by fatigue;Patient limited by pain    Behavior During Therapy [facility] for tasks assessed/performed              Past Medical History:  Diagnosis Date   Anemia    Menstrual periods irregular    Vitamin D deficiency    Past Surgical History:  Procedure Laterality Date   DILATION AND CURETTAGE OF UTERUS  2013   SAB at 10-12 weeks, warranted PRBC and admission overnight. [place], [place]   harrington rod  1987   PILONIDAL CYST EXCISION  2000   Dr.Byrnette   TONSILLECTOMY  1992   Patient Active Problem List   Diagnosis Date Noted   Lateral epicondylitis, left elbow 03/18/2023   Iron deficiency anemia 05/01/2022   Infantile idiopathic scoliosis of thoracolumbar region 12/30/2018   Overweight 12/17/2017   Migraine 12/26/2016   Absolute anemia 09/03/2015   Avitaminosis D 07/30/2008   Irregular bleeding 07/21/2008   Allergic rhinitis 12/15/2001    ONSET DATE: approx Sept 2024  REFERRING DIAG: M25.522 (ICD-10-CM) - Left elbow pain   THERAPY DIAG:  Pain in left elbow  Muscle weakness (generalized)  Rationale for Evaluation and Treatment: Rehabilitation  PERTINENT HISTORY: Pr MD notes: "left lateral epicondylitis ongoing for about a month. Symptoms are pretty significant rated severe at times. We talked about options. Will try some conservative management with oral  diclofenac and Tylenol and home exercise program and referral to Occupational Therapy. If not improving consider steroid injection. Typically this will be done in a month or 6 weeks if not improved. "  She states her elbow started hurting about a month ago after carrying many books home from a book sale.  She's tried ibprophen and tylenol and is feeling a bit better.  She does have a history of a spinal fusion.  She arrives wearing a prefabricated wrist cock up brace today and also claims to have a counterforce brace which she is only been wearing at night and all night long and has been bothering her.  She works in hospice care, types a lot for work.   PRECAUTIONS: past spinal fusion  RED FLAGS: None   WEIGHT BEARING RESTRICTIONS: No    SUBJECTIVE:   SUBJECTIVE STATEMENT: She states exercises were tolerable, and she is feeling a bit less pain now.     PAIN:  Are you having pain?   Yes: NPRS scale: 3-4/10 at rest and in past week at worst up to 8/10 Pain location: Lt lateral epicondyle Pain description: aching with motion Aggravating factors: motion Relieving factors: rest, heat   PATIENT GOALS: To understand her symptoms and relieve her pain and problems from lateral epicondylitis    OBJECTIVE: (All objective assessments below are from initial evaluation on: 03/22/23 unless otherwise specified.)   HAND DOMINANCE: Right   ADLs: Overall  ADLs: States decreased ability to grab, hold household objects, pain and difficulty to open containers, perform FMS tasks (manipulate fasteners on clothing), mild to moderate bathing problems as well.    FUNCTIONAL OUTCOME MEASURES: Eval: Patient Specific Functional Scale: 4/10 (typing, driving, lift coffee cup)  (Higher Score  =  Better Ability for the Selected Tasks)      UPPER EXTREMITY ROM     Shoulder to Wrist AROM Right eval Left eval Lt 03/29/23  Forearm supination  74   Forearm pronation   85   Wrist flexion 75 80 83  Wrist  extension 70 70 66  Sh Ir/Er   29*  / 100*  (Blank rows = not tested)   Hand AROM Left eval  Full Fist Ability (or Gap to Distal Palmar Crease) full  Thumb Opposition  (Kapandji Scale)  10  (Blank rows = not tested)   UPPER EXTREMITY MMT:     MMT Left 03/22/23  Elbow flexion 4/5 tender  Elbow extension 4+/5  Forearm supination 4-/5 pain  Forearm pronation 4-/5 pain  Wrist flexion 5/5  Wrist extension 4-/5 tender  (Blank rows = not tested)  HAND FUNCTION: Eval: Observed weakness in affected Lt hand.  Grip strength Right: 64 lbs, Left: 48 lbs painful  COORDINATION: 03/29/23: Box and Blocks Test: 42 Blocks today (60 is WFL)  Eval: Observed coordination impairments with affected Lt hand.   OBSERVATIONS:   Eval: tender to Lt lat epi, tender resisted wrist ext test, tender in pronation, sup, and elbow ext- tennis elbow.    TODAY'S TREATMENT:  03/29/23: She starts with active range of motion for review of exercises as well as new measures which shows some improvement in wrist flexion from performing stretches.  Next, while she is concurrently on moist heat for 5 minutes, OT.  Her home program and adds a few new exercises today for the shoulder and especially internal rotation as she exhibits some tight external rotation and soreness through the shoulder and neck today.  After heat, she states feeling better but still being sore through the muscles of her extensor wad of the left arm.  After a conversation about manual therapy modality dry needling including benefits, possible negative effects, she agrees to have this performed for her left extensor wad.  OT uses one 0.30 x 50mm needle inserted only partly into her extensor wad to get an excellent twitch response today, no bleeding or lingering pain etc.  She agrees to continue and OT also uses the needle more laterally near the extensor carpi ulnaris, also achieving a twitch response.  Immediately following dry needling she states  feeling looser, less pain, no lingering issues at all.  Additionally, OT performs manual therapy IASTM to the triceps and biceps areas finding large muscle spasms in the biceps today.  These things travel upper arm laterally which is typical with tight internal rotation.  OT applies Kinesiotape to support her wrist extensors and lateral epicondyle, then she performs back the new exercises that are bolded below.  She feels excellent stretches through the neck, shoulder, biceps and triceps.  She states understanding her other home exercises and will continue to watch her postures and routines as well as try to not cause pain or stress through her arm.  She leaves feeling better than when she came in.    Exercises - Sleeper Stretch  - 3-4 x daily - 5 reps - 15-20 sec hold - Standing neck/upper traps stretch  - 4-6 x daily -  3-5 reps - 15 sec hold - Tricep Stretch- DO SEATED BY TABLE  - 3-4 x daily - 3-5 reps - 15 hold - Fridge Door Stretch  - 4 x daily - 3-5 reps - 15 sec hold - Forearm Supination Stretch  - 3-4 x daily - 3-5 reps - 15 sec hold - Forearm Pronation Stretch  - 3-4 x daily - 3-5 reps - 15 sec hold - Wrist Flexion Stretch  - 4 x daily - 3-5 reps - 15 sec hold - Wrist Prayer Stretch  - 4 x daily - 3-5 reps - 15 sec hold - Standing Radial Nerve Glide  - 4-6 x daily - 1 sets - 10-15 reps    PATIENT EDUCATION: Education details: See tx section above for details  Person educated: Patient Education method: Verbal Instruction, Teach back, Handouts  Education comprehension: States and demonstrates understanding, Additional Education required    HOME EXERCISE PROGRAM: Access Code: [email or web address] URL: [email or web address] Date: 03/22/2023 Prepared by: Fannie Knee   GOALS: Goals reviewed with patient? Yes   SHORT TERM GOALS: (STG required if POC>30 days) Target Date: 04/09/23  Pt will demo/state understanding of initial HEP to improve pain levels and prerequisite  motion. Goal status: INITIAL   LONG TERM GOALS: Target Date: 05/07/23  Pt will improve functional ability by decreased impairment per PSFS assessment from 4 /10 to 8/10 or better, for better quality of life. Goal status: INITIAL  2.  Pt will improve grip strength in Lt hand from painful 48lbs to at least 55lbs for functional use at home and in IADLs. Goal status: INITIAL  3.  Pt will improve A/ROM in Lt FA supination AROM from 74 to at least 80, to have functional motion for tasks like reach and grasp.  Goal status: INITIAL  4.  Pt will improve strength in Lt wrist ext from painful 4-/5 MMT to at least 4+/5 MMT to have increased functional ability to carry out selfcare and higher-level homecare tasks with less difficulty. Goal status: INITIAL  5.  Pt will decrease pain at worst from 8/10 to 3/10 or better to have better sleep and occupational participation in daily roles. Goal status: INITIAL  ASSESSMENT:  CLINICAL IMPRESSION: 03/29/23: She did very well with her exercise program so far and limiting pain.  She also got a great benefit from manual therapy today and a new set of exercises.  If she continues to do well and keep pain low we will start light isometric or eccentric strengthening to start building functional strength through the arm.  We will have to be careful for exacerbations, and OT may also have her skip a week of therapy to allow healing and allow stretching before getting into strengthening.   Eval: Patient is a 47 y.o. female who was seen today for occupational therapy evaluation for pain and decreased functional ability especially with the left elbow presenting as lateral epicondylitis.  She also seemed to have some slight radial nerve irritation perhaps from wearing counterforce brace incorrectly and all night long, and she has some triceps tightness which is typical.  She will benefit from outpatient occupational therapy to decrease negative symptoms and increase  functional ability and overall quality of life.      PLAN:  OT FREQUENCY: 1x/week  OT DURATION: 6 weeks through 05/07/23 as needed  PLANNED INTERVENTIONS: 97168 OT Re-evaluation, 97535 self care/ADL training, 32440 therapeutic exercise, 97530 therapeutic activity, 97112 neuromuscular re-education, 97140 manual therapy, 97035 ultrasound, 97039 fluidotherapy, 97010 moist heat, 97010  cryotherapy, 97033 iontophoresis, 97760 Orthotics management and training, [phone] Subsequent splinting/medication, compression bandaging, Dry needling, coping strategies training, and patient/family education  CONSULTED AND AGREED WITH PLAN OF CARE: Patient  PLAN FOR NEXT SESSION:   Review new neck and shoulder stretches as well as how she felt after the session and all of the manual therapy especially kinesiotaping and dry needling.  Carry on, consider giving a week for her to do self performance of exercises before starting new strengthening if she still a bit tender.   Fannie Knee, OTR/L,CHT 03/29/2023, 12:43 PM

## 2023-03-29 ENCOUNTER — Ambulatory Visit (INDEPENDENT_AMBULATORY_CARE_PROVIDER_SITE_OTHER): Payer: BC Managed Care – PPO | Admitting: Rehabilitative and Restorative Service Providers"

## 2023-03-29 ENCOUNTER — Encounter: Payer: Self-pay | Admitting: Rehabilitative and Restorative Service Providers"

## 2023-03-29 DIAGNOSIS — M25522 Pain in left elbow: Secondary | ICD-10-CM

## 2023-03-29 DIAGNOSIS — M6281 Muscle weakness (generalized): Secondary | ICD-10-CM | POA: Diagnosis not present

## 2023-04-01 NOTE — Therapy (Signed)
OUTPATIENT OCCUPATIONAL THERAPY TREATMENT NOTE  Patient Name: Diane Curry MRN: [id number] DOB:[birth year], 47 y.o., female Today's Date: 04/05/2023  PCP: Shirlee Latch, MD REFERRING PROVIDER: Rodolph Bong, MD   END OF SESSION:  OT End of Session - 04/05/23 1349     Visit Number 3    Number of Visits 7    Date for OT Re-Evaluation 05/07/23    Authorization Type BCBS    OT Start Time 1349    OT Stop Time 1427    OT Time Calculation (min) 38 min    Equipment Utilized During Treatment k-tape, [place] gel (for IASTM)    Activity Tolerance Patient tolerated treatment well;No increased pain;Patient limited by fatigue;Patient limited by pain    Behavior During Therapy [facility] for tasks assessed/performed              Past Medical History:  Diagnosis Date   Anemia    Menstrual periods irregular    Vitamin D deficiency    Past Surgical History:  Procedure Laterality Date   DILATION AND CURETTAGE OF UTERUS  2013   SAB at 10-12 weeks, warranted PRBC and admission overnight. [place], [place]   harrington rod  1987   PILONIDAL CYST EXCISION  2000   Dr.Byrnette   TONSILLECTOMY  1992   Patient Active Problem List   Diagnosis Date Noted   Lateral epicondylitis, left elbow 03/18/2023   Iron deficiency anemia 05/01/2022   Infantile idiopathic scoliosis of thoracolumbar region 12/30/2018   Overweight 12/17/2017   Migraine 12/26/2016   Absolute anemia 09/03/2015   Avitaminosis D 07/30/2008   Irregular bleeding 07/21/2008   Allergic rhinitis 12/15/2001    ONSET DATE: approx Sept 2024  REFERRING DIAG: M25.522 (ICD-10-CM) - Left elbow pain   THERAPY DIAG:  Pain in left elbow  Muscle weakness (generalized)  Rationale for Evaluation and Treatment: Rehabilitation  PERTINENT HISTORY: Pr MD notes: "left lateral epicondylitis ongoing for about a month. Symptoms are pretty significant rated severe at times. We talked about options. Will try some conservative management with oral  diclofenac and Tylenol and home exercise program and referral to Occupational Therapy. If not improving consider steroid injection. Typically this will be done in a month or 6 weeks if not improved. "  She states her elbow started hurting about a month ago after carrying many books home from a book sale.  She's tried ibprophen and tylenol and is feeling a bit better.  She does have a history of a spinal fusion.  She arrives wearing a prefabricated wrist cock up brace today and also claims to have a counterforce brace which she is only been wearing at night and all night long and has been bothering her.  She works in hospice care, types a lot for work.   PRECAUTIONS: past spinal fusion  RED FLAGS: None;  WEIGHT BEARING RESTRICTIONS: No    SUBJECTIVE:   SUBJECTIVE STATEMENT: She states was doing well after last therapy and felt good enough to do some work on he rdeck, and then had an exacerbation of pain. Since then she's been more careful, wearing brace, and feeling better again.     PAIN:  Are you having pain?   Yes: NPRS scale: 3/10 at rest and in past week at worst up to 8/10 Pain location: Lt lateral epicondyle Pain description: aching with motion Aggravating factors: motion Relieving factors: rest, heat   PATIENT GOALS: To understand her symptoms and relieve her pain and problems from lateral epicondylitis  OBJECTIVE: (All objective assessments below are from initial evaluation on: 03/22/23 unless otherwise specified.)   HAND DOMINANCE: Right   ADLs: Overall ADLs: States decreased ability to grab, hold household objects, pain and difficulty to open containers, perform FMS tasks (manipulate fasteners on clothing), mild to moderate bathing problems as well.    FUNCTIONAL OUTCOME MEASURES: Eval: Patient Specific Functional Scale: 4/10 (typing, driving, lift coffee cup)  (Higher Score  =  Better Ability for the Selected Tasks)      UPPER EXTREMITY ROM     Shoulder to  Wrist AROM Right eval Left eval Lt 03/29/23 Lt 04/05/23  Forearm supination  74    Forearm pronation   85    Wrist flexion 75 80 83   Wrist extension 70 70 66   Sh Ir/Er   29*  / 100* 38 / 65  (Blank rows = not tested)   Hand AROM Left eval  Full Fist Ability (or Gap to Distal Palmar Crease) full  Thumb Opposition  (Kapandji Scale)  10  (Blank rows = not tested)   UPPER EXTREMITY MMT:     MMT Left 03/22/23  Elbow flexion 4/5 tender  Elbow extension 4+/5  Forearm supination 4-/5 pain  Forearm pronation 4-/5 pain  Wrist flexion 5/5  Wrist extension 4-/5 tender  (Blank rows = not tested)  HAND FUNCTION: Eval: Observed weakness in affected Lt hand.  Grip strength Right: 64 lbs, Left: 48 lbs painful  COORDINATION: 03/29/23: Box and Blocks Test: 42 Blocks today (60 is WFL)  OBSERVATIONS:   Eval: tender to Lt lat epi, tender resisted wrist ext test, tender in pronation, sup, and elbow ext- tennis elbow.   TODAY'S TREATMENT:  04/05/23: She starts with active range of motion which shows some improvement at the shoulder since starting her new stretches last week.  Fortunately she was able to calm her pain exacerbation, and she was not wearing her braces or supports when she did her work outdoors.  We discussed her safety/self-care and to be warming up, stretching, wearing supports as needed for these heavier and repetitive activities.  She states that she believes me now.  Next, OT does manual therapy dry needling again as she requests this because it helped her last session.  OT uses a blank needle inserted only partly into the extensor wad of the left arm getting 2-3 good muscle twitches.  She is also having some tightness and muscle spasm through her biceps and agrees to dry needling there as well-OT uses a blank needle inserted only partly laterally through the biceps muscle belly with no good muscle twitch but a relief of palpable tension afterwards.  After that, OT performs  manual therapy IASTM along the dorsum of the forearm and through the tight bicep spasms.  Lastly, OT does Kinesiotape but she also felt a benefit from this over the past week.  Finally we review her new her exercises as well as all of her exercise program-she states understanding this and not having any significant pain when performing them, she states "I just need to do them consistently still."    03/29/23: She starts with active range of motion for review of exercises as well as new measures which shows some improvement in wrist flexion from performing stretches.  Next, while she is concurrently on moist heat for 5 minutes, OT.  Her home program and adds a few new exercises today for the shoulder and especially internal rotation as she exhibits some tight external rotation and  soreness through the shoulder and neck today.  After heat, she states feeling better but still being sore through the muscles of her extensor wad of the left arm.  After a conversation about manual therapy modality dry needling including benefits, possible negative effects, she agrees to have this performed for her left extensor wad.  OT uses one 0.30 x 50mm needle inserted only partly into her extensor wad to get an excellent twitch response today, no bleeding or lingering pain etc.  She agrees to continue and OT also uses the needle more laterally near the extensor carpi ulnaris, also achieving a twitch response.  Immediately following dry needling she states feeling looser, less pain, no lingering issues at all.  Additionally, OT performs manual therapy IASTM to the triceps and biceps areas finding large muscle spasms in the biceps today.  These things travel upper arm laterally which is typical with tight internal rotation.  OT applies Kinesiotape to support her wrist extensors and lateral epicondyle, then she performs back the new exercises that are bolded below.  She feels excellent stretches through the neck, shoulder, biceps and  triceps.  She states understanding her other home exercises and will continue to watch her postures and routines as well as try to not cause pain or stress through her arm.  She leaves feeling better than when she came in.   Exercises - Sleeper Stretch  - 3-4 x daily - 5 reps - 15-20 sec hold - Standing neck/upper traps stretch  - 4-6 x daily - 3-5 reps - 15 sec hold - Tricep Stretch- DO SEATED BY TABLE  - 3-4 x daily - 3-5 reps - 15 hold - Fridge Door Stretch  - 4 x daily - 3-5 reps - 15 sec hold - Forearm Supination Stretch  - 3-4 x daily - 3-5 reps - 15 sec hold - Forearm Pronation Stretch  - 3-4 x daily - 3-5 reps - 15 sec hold - Wrist Flexion Stretch  - 4 x daily - 3-5 reps - 15 sec hold - Wrist Prayer Stretch  - 4 x daily - 3-5 reps - 15 sec hold - Standing Radial Nerve Glide  - 4-6 x daily - 1 sets - 10-15 reps   PATIENT EDUCATION: Education details: See tx section above for details  Person educated: Patient Education method: Verbal Instruction, Teach back, Handouts  Education comprehension: States and demonstrates understanding, Additional Education required    HOME EXERCISE PROGRAM: Access Code: [email or web address] URL: [email or web address] Date: 03/22/2023 Prepared by: Fannie Knee   GOALS: Goals reviewed with patient? Yes   SHORT TERM GOALS: (STG required if POC>30 days) Target Date: 04/09/23  Pt will demo/state understanding of initial HEP to improve pain levels and prerequisite motion. Goal status: INITIAL   LONG TERM GOALS: Target Date: 05/07/23  Pt will improve functional ability by decreased impairment per PSFS assessment from 4 /10 to 8/10 or better, for better quality of life. Goal status: INITIAL  2.  Pt will improve grip strength in Lt hand from painful 48lbs to at least 55lbs for functional use at home and in IADLs. Goal status: INITIAL  3.  Pt will improve A/ROM in Lt FA supination AROM from 74 to at least 80, to have functional motion for  tasks like reach and grasp.  Goal status: INITIAL  4.  Pt will improve strength in Lt wrist ext from painful 4-/5 MMT to at least 4+/5 MMT to have increased functional ability to carry out selfcare and higher-level homecare  tasks with less difficulty. Goal status: INITIAL  5.  Pt will decrease pain at worst from 8/10 to 3/10 or better to have better sleep and occupational participation in daily roles. Goal status: INITIAL  ASSESSMENT:  CLINICAL IMPRESSION: 04/05/23: She had a bit of an exacerbation and admits to not wearing braces and not following through with certain exercises.  She she states understanding now that this will not go away easily unless she continues her HEP.  Carry on.  Manual therapy is very helpful to her per her self-reports    PLAN:  OT FREQUENCY: 1x/week  OT DURATION: 6 weeks through 05/07/23 as needed  PLANNED INTERVENTIONS: 97168 OT Re-evaluation, 97535 self care/ADL training, 40981 therapeutic exercise, 97530 therapeutic activity, 97112 neuromuscular re-education, 97140 manual therapy, 97035 ultrasound, 97039 fluidotherapy, 97010 moist heat, 97010 cryotherapy, 97033 iontophoresis, 97760 Orthotics management and training, 19147 Subsequent splinting/medication, compression bandaging, Dry needling, coping strategies training, and patient/family education  CONSULTED AND AGREED WITH PLAN OF CARE: Patient  PLAN FOR NEXT SESSION:   Continue to monitor motion, pain, tenderness, continue manual therapy as helpful, she is doing well we can consider giving her a week of self before we get into strengthening.  Try to prevent exacerbation.   Fannie Knee, OTR/L,CHT 04/05/2023, 2:36 PM

## 2023-04-02 ENCOUNTER — Telehealth (HOSPITAL_BASED_OUTPATIENT_CLINIC_OR_DEPARTMENT_OTHER): Payer: Self-pay | Admitting: Obstetrics & Gynecology

## 2023-04-02 NOTE — Telephone Encounter (Signed)
Called patient and left a message to call the office back to schedule the appointment .  

## 2023-04-05 ENCOUNTER — Encounter: Payer: Self-pay | Admitting: Rehabilitative and Restorative Service Providers"

## 2023-04-05 ENCOUNTER — Ambulatory Visit (INDEPENDENT_AMBULATORY_CARE_PROVIDER_SITE_OTHER): Payer: BC Managed Care – PPO | Admitting: Rehabilitative and Restorative Service Providers"

## 2023-04-05 DIAGNOSIS — M25522 Pain in left elbow: Secondary | ICD-10-CM

## 2023-04-05 DIAGNOSIS — M6281 Muscle weakness (generalized): Secondary | ICD-10-CM

## 2023-04-07 ENCOUNTER — Encounter: Payer: Self-pay | Admitting: Oncology

## 2023-04-09 NOTE — Therapy (Signed)
OUTPATIENT OCCUPATIONAL THERAPY TREATMENT NOTE  Patient Name: Diane Curry MRN: [id number] DOB:12/30/1975, 47 y.o., female Today's Date: 04/09/2023  PCP: Shirlee Latch, MD REFERRING PROVIDER: Rodolph Bong, MD   END OF SESSION:     Past Medical History:  Diagnosis Date   Anemia    Menstrual periods irregular    Vitamin D deficiency    Past Surgical History:  Procedure Laterality Date   DILATION AND CURETTAGE OF UTERUS  2013   SAB at 10-12 weeks, warranted PRBC and admission overnight. [place], [place]   harrington rod  1987   PILONIDAL CYST EXCISION  2000   Dr.Byrnette   TONSILLECTOMY  1992   Patient Active Problem List   Diagnosis Date Noted   Lateral epicondylitis, left elbow 03/18/2023   Iron deficiency anemia 05/01/2022   Infantile idiopathic scoliosis of thoracolumbar region 12/30/2018   Overweight 12/17/2017   Migraine 12/26/2016   Absolute anemia 09/03/2015   Avitaminosis D 07/30/2008   Irregular bleeding 07/21/2008   Allergic rhinitis 12/15/2001    ONSET DATE: approx Sept 2024  REFERRING DIAG: M25.522 (ICD-10-CM) - Left elbow pain   THERAPY DIAG:  No diagnosis found.  Rationale for Evaluation and Treatment: Rehabilitation  PERTINENT HISTORY: Pr MD notes: "left lateral epicondylitis ongoing for about a month. Symptoms are pretty significant rated severe at times. We talked about options. Will try some conservative management with oral diclofenac and Tylenol and home exercise program and referral to Occupational Therapy. If not improving consider steroid injection. Typically this will be done in a month or 6 weeks if not improved. "  She states her elbow started hurting about a month ago after carrying many books home from a book sale.  She's tried ibprophen and tylenol and is feeling a bit better.  She does have a history of a spinal fusion.  She arrives wearing a prefabricated wrist cock up brace today and also claims to have a counterforce brace  which she is only been wearing at night and all night long and has been bothering her.  She works in hospice care, types a lot for work.   PRECAUTIONS: past spinal fusion  RED FLAGS: None;  WEIGHT BEARING RESTRICTIONS: No    SUBJECTIVE:   SUBJECTIVE STATEMENT: She states ***    was doing well after last therapy and felt good enough to do some work on he rdeck, and then had an exacerbation of pain. Since then she's been more careful, wearing brace, and feeling better again.     PAIN:  Are you having pain?   Yes: NPRS scale: 3/10 at rest and in past week at worst up to 8/10 Pain location: Lt lateral epicondyle Pain description: aching with motion Aggravating factors: motion Relieving factors: rest, heat   PATIENT GOALS: To understand her symptoms and relieve her pain and problems from lateral epicondylitis    OBJECTIVE: (All objective assessments below are from initial evaluation on: 03/22/23 unless otherwise specified.)   HAND DOMINANCE: Right   ADLs: Overall ADLs: States decreased ability to grab, hold household objects, pain and difficulty to open containers, perform FMS tasks (manipulate fasteners on clothing), mild to moderate bathing problems as well.    FUNCTIONAL OUTCOME MEASURES: Eval: Patient Specific Functional Scale: 4/10 (typing, driving, lift coffee cup)  (Higher Score  =  Better Ability for the Selected Tasks)      UPPER EXTREMITY ROM     Shoulder to Wrist AROM Right eval Left eval Lt 03/29/23 Lt 04/05/23 Lt 04/13/23  Forearm supination  74     Forearm pronation   85     Wrist flexion 75 80 83    Wrist extension 70 70 66  ***  Sh Ir/Er   29*  / 100* 38 / 65 ***  (Blank rows = not tested)   Hand AROM Left eval  Full Fist Ability (or Gap to Distal Palmar Crease) full  Thumb Opposition  (Kapandji Scale)  10  (Blank rows = not tested)   UPPER EXTREMITY MMT:     MMT Left 03/22/23  Elbow flexion 4/5 tender  Elbow extension 4+/5  Forearm  supination 4-/5 pain  Forearm pronation 4-/5 pain  Wrist flexion 5/5  Wrist extension 4-/5 tender  (Blank rows = not tested)  HAND FUNCTION: Eval: Observed weakness in affected Lt hand.  Grip strength Right: 64 lbs, Left: 48 lbs painful  COORDINATION: 03/29/23: Box and Blocks Test: 42 Blocks today (60 is WFL)  OBSERVATIONS:   Eval: tender to Lt lat epi, tender resisted wrist ext test, tender in pronation, sup, and elbow ext- tennis elbow.   TODAY'S TREATMENT:  04/13/23: ***   Continue to monitor motion, pain, tenderness, continue manual therapy as helpful, she is doing well we can consider giving her a week of self before we get into strengthening.  Try to prevent exacerbation.    04/05/23: She starts with active range of motion which shows some improvement at the shoulder since starting her new stretches last week.  Fortunately she was able to calm her pain exacerbation, and she was not wearing her braces or supports when she did her work outdoors.  We discussed her safety/self-care and to be warming up, stretching, wearing supports as needed for these heavier and repetitive activities.  She states that she believes me now.  Next, OT does manual therapy dry needling again as she requests this because it helped her last session.  OT uses a blank needle inserted only partly into the extensor wad of the left arm getting 2-3 good muscle twitches.  She is also having some tightness and muscle spasm through her biceps and agrees to dry needling there as well-OT uses a blank needle inserted only partly laterally through the biceps muscle belly with no good muscle twitch but a relief of palpable tension afterwards.  After that, OT performs manual therapy IASTM along the dorsum of the forearm and through the tight bicep spasms.  Lastly, OT does Kinesiotape but she also felt a benefit from this over the past week.  Finally we review her new her exercises as well as all of her exercise program-she states  understanding this and not having any significant pain when performing them, she states "I just need to do them consistently still."    03/29/23: She starts with active range of motion for review of exercises as well as new measures which shows some improvement in wrist flexion from performing stretches.  Next, while she is concurrently on moist heat for 5 minutes, OT.  Her home program and adds a few new exercises today for the shoulder and especially internal rotation as she exhibits some tight external rotation and soreness through the shoulder and neck today.  After heat, she states feeling better but still being sore through the muscles of her extensor wad of the left arm.  After a conversation about manual therapy modality dry needling including benefits, possible negative effects, she agrees to have this performed for her left extensor wad.  OT uses one 0.30  x 50mm needle inserted only partly into her extensor wad to get an excellent twitch response today, no bleeding or lingering pain etc.  She agrees to continue and OT also uses the needle more laterally near the extensor carpi ulnaris, also achieving a twitch response.  Immediately following dry needling she states feeling looser, less pain, no lingering issues at all.  Additionally, OT performs manual therapy IASTM to the triceps and biceps areas finding large muscle spasms in the biceps today.  These things travel upper arm laterally which is typical with tight internal rotation.  OT applies Kinesiotape to support her wrist extensors and lateral epicondyle, then she performs back the new exercises that are bolded below.  She feels excellent stretches through the neck, shoulder, biceps and triceps.  She states understanding her other home exercises and will continue to watch her postures and routines as well as try to not cause pain or stress through her arm.  She leaves feeling better than when she came in.   Exercises - Sleeper Stretch  - 3-4 x  daily - 5 reps - 15-20 sec hold - Standing neck/upper traps stretch  - 4-6 x daily - 3-5 reps - 15 sec hold - Tricep Stretch- DO SEATED BY TABLE  - 3-4 x daily - 3-5 reps - 15 hold - Fridge Door Stretch  - 4 x daily - 3-5 reps - 15 sec hold - Forearm Supination Stretch  - 3-4 x daily - 3-5 reps - 15 sec hold - Forearm Pronation Stretch  - 3-4 x daily - 3-5 reps - 15 sec hold - Wrist Flexion Stretch  - 4 x daily - 3-5 reps - 15 sec hold - Wrist Prayer Stretch  - 4 x daily - 3-5 reps - 15 sec hold - Standing Radial Nerve Glide  - 4-6 x daily - 1 sets - 10-15 reps   PATIENT EDUCATION: Education details: See tx section above for details  Person educated: Patient Education method: Verbal Instruction, Teach back, Handouts  Education comprehension: States and demonstrates understanding, Additional Education required    HOME EXERCISE PROGRAM: Access Code: [email or web address] URL: [email or web address] Date: 03/22/2023 Prepared by: Fannie Knee   GOALS: Goals reviewed with patient? Yes   SHORT TERM GOALS: (STG required if POC>30 days) Target Date: 04/09/23  Pt will demo/state understanding of initial HEP to improve pain levels and prerequisite motion. Goal status: INITIAL   LONG TERM GOALS: Target Date: 05/07/23  Pt will improve functional ability by decreased impairment per PSFS assessment from 4 /10 to 8/10 or better, for better quality of life. Goal status: INITIAL  2.  Pt will improve grip strength in Lt hand from painful 48lbs to at least 55lbs for functional use at home and in IADLs. Goal status: INITIAL  3.  Pt will improve A/ROM in Lt FA supination AROM from 74 to at least 80, to have functional motion for tasks like reach and grasp.  Goal status: INITIAL  4.  Pt will improve strength in Lt wrist ext from painful 4-/5 MMT to at least 4+/5 MMT to have increased functional ability to carry out selfcare and higher-level homecare tasks with less difficulty. Goal  status: INITIAL  5.  Pt will decrease pain at worst from 8/10 to 3/10 or better to have better sleep and occupational participation in daily roles. Goal status: INITIAL  ASSESSMENT:  CLINICAL IMPRESSION: 04/13/23: ***  04/05/23: She had a bit of an exacerbation and admits to not wearing braces and not following  through with certain exercises.  She she states understanding now that this will not go away easily unless she continues her HEP.  Carry on.  Manual therapy is very helpful to her per her self-reports    PLAN:  OT FREQUENCY: 1x/week  OT DURATION: 6 weeks through 05/07/23 as needed  PLANNED INTERVENTIONS: 97168 OT Re-evaluation, 97535 self care/ADL training, 74259 therapeutic exercise, 97530 therapeutic activity, 97112 neuromuscular re-education, 97140 manual therapy, 97035 ultrasound, 97039 fluidotherapy, 97010 moist heat, 97010 cryotherapy, 97033 iontophoresis, 97760 Orthotics management and training, 56387 Subsequent splinting/medication, compression bandaging, Dry needling, coping strategies training, and patient/family education  CONSULTED AND AGREED WITH PLAN OF CARE: Patient  PLAN FOR NEXT SESSION:   ***   Fannie Knee, OTR/L,CHT 04/09/2023, 9:08 AM

## 2023-04-10 ENCOUNTER — Other Ambulatory Visit (HOSPITAL_BASED_OUTPATIENT_CLINIC_OR_DEPARTMENT_OTHER): Payer: Self-pay | Admitting: Obstetrics & Gynecology

## 2023-04-12 ENCOUNTER — Encounter (HOSPITAL_BASED_OUTPATIENT_CLINIC_OR_DEPARTMENT_OTHER): Payer: Self-pay | Admitting: Obstetrics & Gynecology

## 2023-04-12 ENCOUNTER — Ambulatory Visit (HOSPITAL_BASED_OUTPATIENT_CLINIC_OR_DEPARTMENT_OTHER): Payer: BC Managed Care – PPO | Admitting: Obstetrics & Gynecology

## 2023-04-12 VITALS — BP 132/89 | HR 73 | Ht 65.0 in | Wt 165.2 lb

## 2023-04-12 DIAGNOSIS — N920 Excessive and frequent menstruation with regular cycle: Secondary | ICD-10-CM | POA: Diagnosis not present

## 2023-04-12 DIAGNOSIS — D500 Iron deficiency anemia secondary to blood loss (chronic): Secondary | ICD-10-CM | POA: Diagnosis not present

## 2023-04-12 DIAGNOSIS — R790 Abnormal level of blood mineral: Secondary | ICD-10-CM | POA: Diagnosis not present

## 2023-04-12 NOTE — Progress Notes (Unsigned)
GYNECOLOGY  VISIT  CC:   discuss bleeding  HPI: 47 y.o. G4P2 Married [place] female here for discussion of chronic anemia that has been off and on for many years.  Cycles started around age 26.  Cycles have always been heavy.  For two days, she is using pads and tampons and has to change every two hours.  She does pass clots as well.  Flow lasts for another three days but is lighter during this time.  She knows she had anemia with both of her pregnancies and has anemia over the past several years.  Review of labs in [facility] showed anemia as far back as 9 years.  With iron evaluation, she's had low ferritin and low iron multiple times.   She has seen Dr. Smith Robert at the [facility].  She has done iron infusions over the past year and this really has helped.  Most recent hb 8/16 showed hb of 11.5,  Ferritin was 193.    Last pap smear 04/2021.  Has not had any imaging.  Is interested in treatment.  Feel we should know if there are any abnormalities with imaging.  Also, should plan endometrial biopsy as well.  Pt comfortable with proceeding with evaluation.   Past Medical History:  Diagnosis Date   Anemia    Menstrual periods irregular    Vitamin D deficiency     MEDS:   Current Outpatient Medications on File Prior to Visit  Medication Sig Dispense Refill   acetaminophen (TYLENOL) 325 MG tablet Take 650 mg by mouth every 6 (six) hours as needed.     ibuprofen (ADVIL,MOTRIN) 200 MG tablet Take 200 mg by mouth every 6 (six) hours as needed.     loratadine (CLARITIN) 10 MG tablet Take 10 mg by mouth daily.     meloxicam (MOBIC) 15 MG tablet One tab PO qAM with breakfast for 2 weeks, then daily prn pain. 30 tablet 3   metaxalone (SKELAXIN) 800 MG tablet Take 1 tablet (800 mg total) by mouth 3 (three) times daily as needed for muscle spasms. 30 tablet 1   SUMAtriptan (IMITREX) 100 MG tablet Take 1 tablet (100 mg total) by mouth once as needed for up to 1 dose. May repeat in 2 hours if headache persists  or recurs. 10 tablet 11   Multiple Vitamins-Minerals (WOMENS MULTIVITAMIN PO) Take by mouth. (Patient not taking: Reported on 04/12/2023)     Current Facility-Administered Medications on File Prior to Visit  Medication Dose Route Frequency Provider Last Rate Last Admin   acetaminophen (TYLENOL) tablet 650 mg  650 mg Oral Once Creig Hines, MD       diphenhydrAMINE (BENADRYL) capsule 25 mg  25 mg Oral Once Creig Hines, MD        ALLERGIES: Patient has no known allergies.  SH:  married, non smoker  Review of Systems  Constitutional: Negative.   Genitourinary:        Menorrhagia    PHYSICAL EXAMINATION:    BP 132/89 (BP Location: Left Arm, Patient Position: Sitting, Cuff Size: Large)   Pulse 73   Ht 5\' 5"  (1.651 m)   Wt 165 lb 3.2 oz (74.9 kg)   LMP 04/04/2023   BMI 27.49 kg/m     Physical Exam Constitutional:      Appearance: Normal appearance.  Neurological:     General: No focal deficit present.     Mental Status: She is alert.  Psychiatric:  Mood and Affect: Mood normal.      Assessment/Plan: 1. Menorrhagia with regular cycle - pt will return for ultrasound and probable endometrial biopsy as well for additional evaluation and then to discuss treatment options  2. Iron deficiency anemia due to chronic blood loss - followed by oncology.  Has received IV iron infusions this past year.  3. Low ferritin - improved with most recent lab work in August.  Total time with pt:  28 minutes Documentation:  6 minutes Total time:  34 minutes

## 2023-04-13 ENCOUNTER — Ambulatory Visit (INDEPENDENT_AMBULATORY_CARE_PROVIDER_SITE_OTHER): Payer: BC Managed Care – PPO | Admitting: Rehabilitative and Restorative Service Providers"

## 2023-04-13 ENCOUNTER — Encounter: Payer: BC Managed Care – PPO | Admitting: Rehabilitative and Restorative Service Providers"

## 2023-04-13 ENCOUNTER — Encounter: Payer: Self-pay | Admitting: Rehabilitative and Restorative Service Providers"

## 2023-04-13 DIAGNOSIS — M6281 Muscle weakness (generalized): Secondary | ICD-10-CM

## 2023-04-13 DIAGNOSIS — M25522 Pain in left elbow: Secondary | ICD-10-CM

## 2023-04-15 ENCOUNTER — Telehealth: Payer: Self-pay | Admitting: *Deleted

## 2023-04-15 NOTE — Telephone Encounter (Signed)
Patient called stating that she has lab appointment tomorrow with [place] and that she has to have labs with pcp also and would like to come by and pick up a lab order to take with her to get labs drawn together. Please return her call when order is ready

## 2023-04-16 ENCOUNTER — Inpatient Hospital Stay: Payer: BC Managed Care – PPO | Attending: Oncology

## 2023-04-16 ENCOUNTER — Encounter: Payer: Self-pay | Admitting: Oncology

## 2023-04-16 NOTE — Therapy (Signed)
OUTPATIENT OCCUPATIONAL THERAPY TREATMENT NOTE  Patient Name: Diane Curry MRN: [id number] DOB:19-Sep-1975, 47 y.o., female Today's Date: 04/19/2023  PCP: Shirlee Latch, MD REFERRING PROVIDER: Rodolph Bong, MD   END OF SESSION:  OT End of Session - 04/19/23 1347     Visit Number 5    Number of Visits 7    Date for OT Re-Evaluation 05/07/23    Authorization Type [employer]    OT Start Time 1348    OT Stop Time 1440    OT Time Calculation (min) 52 min    Equipment Utilized During Treatment neoprene strapping    Activity Tolerance Patient tolerated treatment well;No increased pain;Patient limited by fatigue;Patient limited by pain    Behavior During Therapy [facility] for tasks assessed/performed                Past Medical History:  Diagnosis Date   Anemia    Menorrhagia    Migraines    without aura   Vitamin D deficiency    Past Surgical History:  Procedure Laterality Date   DILATION AND CURETTAGE OF UTERUS  2013   SAB at 10-12 weeks, warranted PRBC and admission overnight. [place], [place]   harrington rod  1987   PILONIDAL CYST EXCISION  2000   Dr.Byrnette   TONSILLECTOMY  1992   Patient Active Problem List   Diagnosis Date Noted   Lateral epicondylitis, left elbow 03/18/2023   Iron deficiency anemia 05/01/2022   Infantile idiopathic scoliosis of thoracolumbar region 12/30/2018   Overweight 12/17/2017   Migraine 12/26/2016   Absolute anemia 09/03/2015   Avitaminosis D 07/30/2008   Irregular bleeding 07/21/2008   Allergic rhinitis 12/15/2001    ONSET DATE: approx Sept 2024  REFERRING DIAG: M25.522 (ICD-10-CM) - Left elbow pain   THERAPY DIAG:  Pain in left elbow  Muscle weakness (generalized)  Rationale for Evaluation and Treatment: Rehabilitation  PERTINENT HISTORY: Pr MD notes: "left lateral epicondylitis ongoing for about a month. Symptoms are pretty significant rated severe at times. We talked about options. Will try some conservative management  with oral diclofenac and Tylenol and home exercise program and referral to Occupational Therapy. If not improving consider steroid injection. Typically this will be done in a month or 6 weeks if not improved. "  She states her elbow started hurting about a month ago after carrying many books home from a book sale.  She's tried ibprophen and tylenol and is feeling a bit better.  She does have a history of a spinal fusion.  She arrives wearing a prefabricated wrist cock up brace today and also claims to have a counterforce brace which she is only been wearing at night and all night long and has been bothering her.  She works in hospice care, types a lot for work.   PRECAUTIONS: past spinal fusion  RED FLAGS: None;  WEIGHT BEARING RESTRICTIONS: No    SUBJECTIVE:   SUBJECTIVE STATEMENT: She states no significant pain but some tightness in the left elbow and tightness around the left shoulder coming down the arm a bit.  She would like some additional stretches for the shoulder if possible.  She states doing strengthening 2-3 times a day and feeling a bit sore, and OT advises her that is fine to do strengthening once or twice a day and to remember to skip a day if needed.     PAIN:  Are you having pain?   Yes: NPRS scale:  0-1/10 at rest now Pain location: Lt  lateral epicondyle Pain description: aching with motion Aggravating factors: motion Relieving factors: rest, heat   PATIENT GOALS: To understand her symptoms and relieve her pain and problems from lateral epicondylitis    OBJECTIVE: (All objective assessments below are from initial evaluation on: 03/22/23 unless otherwise specified.)   HAND DOMINANCE: Right   ADLs: Overall ADLs: States decreased ability to grab, hold household objects, pain and difficulty to open containers, perform FMS tasks (manipulate fasteners on clothing), mild to moderate bathing problems as well.    FUNCTIONAL OUTCOME MEASURES: Eval: Patient Specific  Functional Scale: 4/10 (typing, driving, lift coffee cup)  (Higher Score  =  Better Ability for the Selected Tasks)      UPPER EXTREMITY ROM     Shoulder to Wrist AROM Right eval Left eval Lt 03/29/23 Lt 04/05/23 Lt 04/13/23  Forearm supination  74     Forearm pronation   85     Wrist flexion 75 80 83  76  Wrist extension 70 70 66  60  Sh Ir/Er   29*  / 100* 38 / 65 26 / 90  (Blank rows = not tested)   Hand AROM Left eval  Full Fist Ability (or Gap to Distal Palmar Crease) full  Thumb Opposition  (Kapandji Scale)  10  (Blank rows = not tested)   UPPER EXTREMITY MMT:     MMT Left 03/22/23  Elbow flexion 4/5 tender  Elbow extension 4+/5  Forearm supination 4-/5 pain  Forearm pronation 4-/5 pain  Wrist flexion 5/5  Wrist extension 4-/5 tender  (Blank rows = not tested)  HAND FUNCTION: Eval: Observed weakness in affected Lt hand.  Grip strength Right: 64 lbs, Left: 48 lbs painful  COORDINATION: 03/29/23: Box and Blocks Test: 42 Blocks today (60 is WFL)  OBSERVATIONS:   Eval: tender to Lt lat epi, tender resisted wrist ext test, tender in pronation, sup, and elbow ext- tennis elbow.   TODAY'S TREATMENT:  04/19/23: As she has some mild tightness but no significant pain to start, OT does IASTM manual therapy for myofascial release along the left dorsal forearm from ECRB at the wrist to ECRB at the elbow.  She states this feels like a healthy good stretch with no pain and feels looser at the end.  Next, OT gives her compressive wrist wrap to be worn around the elbow similar to a counterforce brace during strengthening today.  She performs several her stretches from her HEP and then is educated on several new shoulder stretches today as she states the left shoulder feels a bit stiff and endrange flexion and abduction.  She is educated on wall climbs in flexion and abduction getting good stretches there as well as cross body stretches-she is also reminded of internal rotation  stretches.  She does tricep and bicep stretches followed by forearm and wrist stretches and gets into the following exercises for whole arm strengthening (as bolded and listed below).  She uses red therapy band for most exercises, and a 5 pound weight for wrist flexion exercises.  She is educated to do wrist extension in an eccentric fashion today and she should continue this for 1 week, and then try, eccentric wrist extension with the same weight-focusing on reps versus increasing weight rapidly.  She states understanding these things, tolerates them all well today and leaves in no significant pain or tenderness.    Exercises (new, whole arm) -Sh wall climbs & cross-body stretches - Hammer Stretch or Strength   - 2-4 x  daily - 1-2 sets - 10-15 reps - Wrist Flexion with Resistance  - 2-4 x daily - 1-2 sets - 10-15 reps - Wrist Extension (eccentric and concentric after 1 week) with Resistance  - 2-4 x daily - 1-2 sets - 10-15 reps - Standing Bicep Curls with Resistance  - 2-4 x daily - 1-2 sets - 10-15 reps - Tricep Kick Back with Resistance  - 2-4 x daily - 1-2 sets - 10-15 reps   PATIENT EDUCATION: Education details: See tx section above for details  Person educated: Patient Education method: Engineer, structural, Teach back, Handouts  Education comprehension: States and demonstrates understanding, Additional Education required    HOME EXERCISE PROGRAM: Access Code: [email or web address] URL: [email or web address] Date: 03/22/2023 Prepared by: Fannie Knee   GOALS: Goals reviewed with patient? Yes   SHORT TERM GOALS: (STG required if POC>30 days) Target Date: 04/09/23  Pt will demo/state understanding of initial HEP to improve pain levels and prerequisite motion. Goal status: 04/13/23: MET   LONG TERM GOALS: Target Date: 05/07/23  Pt will improve functional ability by decreased impairment per PSFS assessment from 4 /10 to 8/10 or better, for better quality of life. Goal  status: INITIAL  2.  Pt will improve grip strength in Lt hand from painful 48lbs to at least 55lbs for functional use at home and in IADLs. Goal status: INITIAL  3.  Pt will improve A/ROM in Lt FA supination AROM from 74 to at least 80, to have functional motion for tasks like reach and grasp.  Goal status: INITIAL  4.  Pt will improve strength in Lt wrist ext from painful 4-/5 MMT to at least 4+/5 MMT to have increased functional ability to carry out selfcare and higher-level homecare tasks with less difficulty. Goal status: INITIAL  5.  Pt will decrease pain at worst from 8/10 to 3/10 or better to have better sleep and occupational participation in daily roles. Goal status: INITIAL  ASSESSMENT:  CLINICAL IMPRESSION: 04/19/23: She is doing really good with new concentric strengthening and whole arm progressive resistive exercises and activities that we will withhold treatment for 1 to 2 weeks to allow self-care management as well as a family trip that she is planned, and then we will follow-up for progress note and likely discharge if all goals have been met   PLAN:  OT FREQUENCY: 1x/week  OT DURATION: 6 weeks through 05/07/23 as needed  PLANNED INTERVENTIONS: 97168 OT Re-evaluation, 97535 self care/ADL training, 16109 therapeutic exercise, 97530 therapeutic activity, 97112 neuromuscular re-education, 97140 manual therapy, 97035 ultrasound, 97039 fluidotherapy, 97010 moist heat, 97010 cryotherapy, 97033 iontophoresis, 97760 Orthotics management and training, 60454 Subsequent splinting/medication, compression bandaging, Dry needling, coping strategies training, and patient/family education  CONSULTED AND AGREED WITH PLAN OF CARE: Patient  PLAN FOR NEXT SESSION:   We will f/u in 3 weeks after her family trip, for a progress note with a review of all goals and to determine any more need for therapy.   Fannie Knee, OTR/L,CHT 04/19/2023, 3:00 PM

## 2023-04-19 ENCOUNTER — Encounter: Payer: Self-pay | Admitting: Rehabilitative and Restorative Service Providers"

## 2023-04-19 ENCOUNTER — Ambulatory Visit (INDEPENDENT_AMBULATORY_CARE_PROVIDER_SITE_OTHER): Payer: BC Managed Care – PPO | Admitting: Rehabilitative and Restorative Service Providers"

## 2023-04-19 DIAGNOSIS — M25522 Pain in left elbow: Secondary | ICD-10-CM

## 2023-04-19 DIAGNOSIS — M6281 Muscle weakness (generalized): Secondary | ICD-10-CM

## 2023-04-20 ENCOUNTER — Encounter: Payer: Self-pay | Admitting: Family Medicine

## 2023-04-20 ENCOUNTER — Ambulatory Visit (INDEPENDENT_AMBULATORY_CARE_PROVIDER_SITE_OTHER): Payer: BC Managed Care – PPO | Admitting: Family Medicine

## 2023-04-20 VITALS — BP 126/83 | HR 78 | Ht 63.0 in | Wt 167.6 lb

## 2023-04-20 DIAGNOSIS — D500 Iron deficiency anemia secondary to blood loss (chronic): Secondary | ICD-10-CM

## 2023-04-20 DIAGNOSIS — E785 Hyperlipidemia, unspecified: Secondary | ICD-10-CM | POA: Diagnosis not present

## 2023-04-20 DIAGNOSIS — G43009 Migraine without aura, not intractable, without status migrainosus: Secondary | ICD-10-CM

## 2023-04-20 DIAGNOSIS — E559 Vitamin D deficiency, unspecified: Secondary | ICD-10-CM

## 2023-04-20 DIAGNOSIS — Z0000 Encounter for general adult medical examination without abnormal findings: Secondary | ICD-10-CM | POA: Diagnosis not present

## 2023-04-20 MED ORDER — SUMATRIPTAN SUCCINATE 100 MG PO TABS: 100.0000 mg | ORAL_TABLET | Freq: Once | ORAL | 11 refills | Status: DC | PRN

## 2023-04-20 MED ORDER — METAXALONE 800 MG PO TABS: 800.0000 mg | ORAL_TABLET | Freq: Three times a day (TID) | ORAL | 1 refills | Status: DC | PRN

## 2023-04-20 NOTE — Progress Notes (Signed)
Complete physical exam  Patient: Diane Curry   DOB: [birth year]   47 y.o. Female  MRN: [id number]  Subjective:    Chief Complaint  Patient presents with   Annual Exam    Last completed 04/14/22 She reports consuming a intermittent fasting diet and participates in exercise twice a week for 20-30 minutes. She reports to be feeling well and sleeping well with no concerns.     Diane Curry is a 47 y.o. female who presents today for a complete physical exam.   Discussed the use of AI scribe software for clinical note transcription with the patient, who gave verbal consent to proceed.  History of Present Illness   The patient, with a history of anemia and tennis elbow, presents for a routine check-up. They have been attending occupational therapy for their tennis elbow and report significant improvement, with strengthening exercises now being incorporated into their therapy.  The patient's anemia has been a recurring issue, with iron infusions required approximately every six months.  They have been keeping up with their regular health checks, including mammograms and flu shots, and are up-to-date with their Pap smear.       Most recent fall risk assessment:    04/20/2023    8:14 AM  Fall Risk   Falls in the past year? 0  Number falls in past yr: 0  Injury with Fall? 0  Risk for fall due to : No Fall Risks  Follow up Falls evaluation completed     Most recent depression screenings:    04/20/2023    8:14 AM 04/12/2023   11:15 AM  PHQ 2/9 Scores  PHQ - 2 Score 0 0        Patient Care Team: Erasmo Downer, MD as PCP - General (Family Medicine) Creig Hines, MD as Consulting Physician (Oncology)   Outpatient Medications Prior to Visit  Medication Sig   acetaminophen (TYLENOL) 325 MG tablet Take 650 mg by mouth every 6 (six) hours as needed.   ibuprofen (ADVIL,MOTRIN) 200 MG tablet Take 200 mg by mouth every 6 (six) hours as needed.   loratadine  (CLARITIN) 10 MG tablet Take 10 mg by mouth daily.   meloxicam (MOBIC) 15 MG tablet One tab PO qAM with breakfast for 2 weeks, then daily prn pain.   Multiple Vitamins-Minerals (WOMENS MULTIVITAMIN PO) Take by mouth.   [DISCONTINUED] metaxalone (SKELAXIN) 800 MG tablet Take 1 tablet (800 mg total) by mouth 3 (three) times daily as needed for muscle spasms.   [DISCONTINUED] SUMAtriptan (IMITREX) 100 MG tablet Take 1 tablet (100 mg total) by mouth once as needed for up to 1 dose. May repeat in 2 hours if headache persists or recurs.   Facility-Administered Medications Prior to Visit  Medication Dose Route Frequency Provider   acetaminophen (TYLENOL) tablet 650 mg  650 mg Oral Once Creig Hines, MD   diphenhydrAMINE (BENADRYL) capsule 25 mg  25 mg Oral Once Creig Hines, MD    ROS     Objective:     BP 126/83 (BP Location: Left Arm, Patient Position: Sitting, Cuff Size: Normal)   Pulse 78   Ht 5\' 3"  (1.6 m)   Wt 167 lb 9.6 oz (76 kg)   LMP 04/04/2023   SpO2 99%   BMI 29.69 kg/m    Physical Exam Vitals reviewed.  Constitutional:      General: She is not in acute distress.    Appearance: Normal appearance. She is well-developed.  She is not diaphoretic.  HENT:     Head: Normocephalic and atraumatic.     Right Ear: Tympanic membrane, ear canal and external ear normal.     Left Ear: Tympanic membrane, ear canal and external ear normal.     Nose: Nose normal.     Mouth/Throat:     Mouth: Mucous membranes are moist.     Pharynx: Oropharynx is clear. No oropharyngeal exudate.  Eyes:     General: No scleral icterus.    Conjunctiva/sclera: Conjunctivae normal.     Pupils: Pupils are equal, round, and reactive to light.  Neck:     Thyroid: No thyromegaly.  Cardiovascular:     Rate and Rhythm: Normal rate and regular rhythm.     Heart sounds: Normal heart sounds. No murmur heard. Pulmonary:     Effort: Pulmonary effort is normal. No respiratory distress.     Breath sounds:  Normal breath sounds. No wheezing or rales.  Abdominal:     General: There is no distension.     Palpations: Abdomen is soft.     Tenderness: There is no abdominal tenderness.  Musculoskeletal:        General: No deformity.     Cervical back: Neck supple.     Right lower leg: No edema.     Left lower leg: No edema.  Lymphadenopathy:     Cervical: No cervical adenopathy.  Skin:    General: Skin is warm and dry.     Findings: No rash.  Neurological:     Mental Status: She is alert and oriented to person, place, and time. Mental status is at baseline.     Gait: Gait normal.  Psychiatric:        Mood and Affect: Mood normal.        Behavior: Behavior normal.        Thought Content: Thought content normal.      No results found for any visits on 04/20/23.     Assessment & Plan:    Routine Health Maintenance and Physical Exam  Immunization History  Administered Date(s) Administered   Influenza,inj,Quad PF,6+ Mos 02/08/2019   Influenza-Unspecified 02/16/2018, 03/28/2021, 03/06/2022   PFIZER(Purple Top)SARS-COV-2 Vaccination 06/16/2019, 07/07/2019   Pfizer Covid-19 Vaccine Bivalent Booster 34yrs & up 03/22/2020   Td 12/17/2017   Tdap 06/17/2006    Health Maintenance  Topic Date Due   COVID-19 Vaccine (4 - 2023-24 season) 01/31/2023   Fecal DNA (Cologuard)  04/23/2024   MAMMOGRAM  11/02/2024   Cervical Cancer Screening (HPV/Pap Cotest)  04/10/2026   DTaP/Tdap/Td (3 - Td or Tdap) 12/18/2027   INFLUENZA VACCINE  Completed   Hepatitis C Screening  Completed   HIV Screening  Completed   HPV VACCINES  Aged Out    Discussed health benefits of physical activity, and encouraged her to engage in regular exercise appropriate for her age and condition.  Problem List Items Addressed This Visit       Cardiovascular and Mediastinum   Migraine   Relevant Medications   metaxalone (SKELAXIN) 800 MG tablet   SUMAtriptan (IMITREX) 100 MG tablet     Other   Avitaminosis D    Relevant Orders   VITAMIN D 25 Hydroxy (Vit-D Deficiency, Fractures)   Iron deficiency anemia   Relevant Orders   CBC w/Diff/Platelet   Iron, TIBC and Ferritin Panel   Other Visit Diagnoses     Encounter for annual physical exam    -  Primary   Relevant  Orders   CBC w/Diff/Platelet   Iron, TIBC and Ferritin Panel   VITAMIN D 25 Hydroxy (Vit-D Deficiency, Fractures)   Comprehensive metabolic panel   Lipid panel   Mild hyperlipidemia       Relevant Orders   Comprehensive metabolic panel   Lipid panel          Lateral Epicondylitis (Tennis Elbow) Progressing to strengthening exercises in occupational therapy. One more therapy appointment scheduled between Thanksgiving and Christmas to determine if release is appropriate. - Continue occupational therapy - Evaluate for potential release at next therapy appointment  Iron Deficiency Anemia Requires iron infusions approximately every six months. Exploring options with Dr. Hyacinth Meeker, including ultrasound and endometrial biopsy to identify structural causes. Discussed inconvenience of frequent infusions and need for alternative approaches. - Perform ultrasound on December 4 - Planning for endometrial biopsy during ultrasound - Follow up with Dr. Hyacinth Meeker to discuss options - Coordinate lab results with Dr. Smith Robert  Migraine Ongoing management with sumatriptan. - Refill sumatriptan prescription  General Health Maintenance Up to date on mammogram (June 2024) and flu shot. Pap smear not due until 2027. Opts to skip new COVID booster. Plans colonoscopy next year due to anemia. Discussed importance of shingles vaccine and potential complications. - Order CBC, iron panel with ferritin, vitamin D, CMP, and lipid panel - Schedule colonoscopy next year - Continue annual mammograms - Skip COVID booster as per preference - Consider shingles vaccine when eligible  Follow-up - Notify via MyChart when lab results are available - Inform Cordelia Pen to  have Dr. Smith Robert review CBC and iron panel results.       Return in about 1 year (around 04/19/2024) for CPE.     Shirlee Latch, MD

## 2023-04-21 LAB — COMPREHENSIVE METABOLIC PANEL
ALT: 23 [IU]/L (ref 0–32)
AST: 18 [IU]/L (ref 0–40)
Albumin: 4.4 g/dL (ref 3.9–4.9)
Alkaline Phosphatase: 52 [IU]/L (ref 44–121)
BUN/Creatinine Ratio: 15 (ref 9–23)
BUN: 11 mg/dL (ref 6–24)
Bilirubin Total: 0.2 mg/dL (ref 0.0–1.2)
CO2: 21 mmol/L (ref 20–29)
Calcium: 9.1 mg/dL (ref 8.7–10.2)
Chloride: 104 mmol/L (ref 96–106)
Creatinine, Ser: 0.72 mg/dL (ref 0.57–1.00)
Globulin, Total: 2 g/dL (ref 1.5–4.5)
Glucose: 91 mg/dL (ref 70–99)
Potassium: 4.1 mmol/L (ref 3.5–5.2)
Sodium: 140 mmol/L (ref 134–144)
Total Protein: 6.4 g/dL (ref 6.0–8.5)
eGFR: 102 mL/min/{1.73_m2} (ref >59)

## 2023-04-21 LAB — LIPID PANEL
Chol/HDL Ratio: 3.6 ratio (ref 0.0–4.4)
Cholesterol, Total: 208 mg/dL — ABNORMAL HIGH (ref 100–199)
HDL: 57 mg/dL (ref >39)
LDL Chol Calc (NIH): 135 mg/dL — ABNORMAL HIGH (ref 0–99)
Triglycerides: 92 mg/dL (ref 0–149)
VLDL Cholesterol Cal: 16 mg/dL (ref 5–40)

## 2023-04-21 LAB — CBC WITH DIFFERENTIAL/PLATELET
Basophils Absolute: 0 10*3/uL (ref 0.0–0.2)
Basos: 1 %
EOS (ABSOLUTE): 0.1 10*3/uL (ref 0.0–0.4)
Eos: 2 %
Hematocrit: 37 % (ref 34.0–46.6)
Hemoglobin: 11.8 g/dL (ref 11.1–15.9)
Immature Grans (Abs): 0 10*3/uL (ref 0.0–0.1)
Immature Granulocytes: 0 %
Lymphocytes Absolute: 2 10*3/uL (ref 0.7–3.1)
Lymphs: 33 %
MCH: 28 pg (ref 26.6–33.0)
MCHC: 31.9 g/dL (ref 31.5–35.7)
MCV: 88 fL (ref 79–97)
Monocytes Absolute: 0.4 10*3/uL (ref 0.1–0.9)
Monocytes: 7 %
Neutrophils Absolute: 3.4 10*3/uL (ref 1.4–7.0)
Neutrophils: 57 %
Platelets: 332 10*3/uL (ref 150–450)
RBC: 4.22 x10E6/uL (ref 3.77–5.28)
RDW: 12.4 % (ref 11.7–15.4)
WBC: 6 10*3/uL (ref 3.4–10.8)

## 2023-04-21 LAB — IRON,TIBC AND FERRITIN PANEL
Ferritin: 121 ng/mL (ref 15–150)
Iron Saturation: 29 % (ref 15–55)
Iron: 74 ug/dL (ref 27–159)
Total Iron Binding Capacity: 251 ug/dL (ref 250–450)
UIBC: 177 ug/dL (ref 131–425)

## 2023-04-21 LAB — VITAMIN D 25 HYDROXY (VIT D DEFICIENCY, FRACTURES): Vit D, 25-Hydroxy: 31.4 ng/mL (ref 30.0–100.0)

## 2023-05-05 ENCOUNTER — Ambulatory Visit (HOSPITAL_BASED_OUTPATIENT_CLINIC_OR_DEPARTMENT_OTHER): Payer: BC Managed Care – PPO

## 2023-05-05 ENCOUNTER — Ambulatory Visit (HOSPITAL_BASED_OUTPATIENT_CLINIC_OR_DEPARTMENT_OTHER): Payer: BC Managed Care – PPO | Admitting: Obstetrics & Gynecology

## 2023-05-05 ENCOUNTER — Encounter (HOSPITAL_BASED_OUTPATIENT_CLINIC_OR_DEPARTMENT_OTHER): Payer: Self-pay | Admitting: Obstetrics & Gynecology

## 2023-05-05 VITALS — BP 129/65 | HR 85 | Wt 166.8 lb

## 2023-05-05 DIAGNOSIS — D251 Intramural leiomyoma of uterus: Secondary | ICD-10-CM | POA: Diagnosis not present

## 2023-05-05 DIAGNOSIS — G43009 Migraine without aura, not intractable, without status migrainosus: Secondary | ICD-10-CM

## 2023-05-05 DIAGNOSIS — D500 Iron deficiency anemia secondary to blood loss (chronic): Secondary | ICD-10-CM

## 2023-05-05 DIAGNOSIS — D252 Subserosal leiomyoma of uterus: Secondary | ICD-10-CM

## 2023-05-05 DIAGNOSIS — N920 Excessive and frequent menstruation with regular cycle: Secondary | ICD-10-CM

## 2023-05-07 NOTE — Therapy (Incomplete)
OUTPATIENT OCCUPATIONAL THERAPY TREATMENT & *** NOTE  Patient Name: Diane Curry MRN: [id number] DOB:January 18, 1976, 47 y.o., female Today's Date: 05/07/2023  PCP: Shirlee Latch, MD REFERRING PROVIDER: Rodolph Bong, MD     ***     END OF SESSION:       Past Medical History:  Diagnosis Date   Anemia    Menorrhagia    Migraines    without aura   Vitamin D deficiency    Past Surgical History:  Procedure Laterality Date   DILATION AND CURETTAGE OF UTERUS  2013   SAB at 10-12 weeks, warranted PRBC and admission overnight. [place],    harrington rod  1987   PILONIDAL CYST EXCISION  2000   Dr.Byrnette   SPINE SURGERY  1986   Harrington rods for scolosis   TONSILLECTOMY  1992   Patient Active Problem List   Diagnosis Date Noted   Lateral epicondylitis, left elbow 03/18/2023   Iron deficiency anemia 05/01/2022   Infantile idiopathic scoliosis of thoracolumbar region 12/30/2018   Overweight 12/17/2017   Migraine 12/26/2016   Absolute anemia 09/03/2015   Avitaminosis D 07/30/2008   Irregular bleeding 07/21/2008   Allergic rhinitis 12/15/2001    ONSET DATE: approx Sept 2024  REFERRING DIAG: M25.522 (ICD-10-CM) - Left elbow pain   THERAPY DIAG:  No diagnosis found.  Rationale for Evaluation and Treatment: Rehabilitation  PERTINENT HISTORY: Pr MD notes: "left lateral epicondylitis ongoing for about a month. Symptoms are pretty significant rated severe at times. We talked about options. Will try some conservative management with oral diclofenac and Tylenol and home exercise program and referral to Occupational Therapy. If not improving consider steroid injection. Typically this will be done in a month or 6 weeks if not improved. "  She states her elbow started hurting about a month ago after carrying many books home from a book sale.  She's tried ibprophen and tylenol and is feeling a bit better.  She does have a history of a spinal fusion.  She arrives  wearing a prefabricated wrist cock up brace today and also claims to have a counterforce brace which she is only been wearing at night and all night long and has been bothering her.  She works in hospice care, types a lot for work.   PRECAUTIONS: past spinal fusion  RED FLAGS: None;  WEIGHT BEARING RESTRICTIONS: No    SUBJECTIVE:   SUBJECTIVE STATEMENT: She returns after 3 weeks of not being seen due to a family vacation and states  ***   no significant pain but some tightness in the left elbow and tightness around the left shoulder coming down the arm a bit.  She would like some additional stretches for the shoulder if possible.  She states doing strengthening 2-3 times a day and feeling a bit sore, and OT advises her that is fine to do strengthening once or twice a day and to remember to skip a day if needed.     PAIN:  Are you having pain?   Yes: NPRS scale: *** 0-1/10 at rest now Pain location: Lt lateral epicondyle Pain description: aching with motion Aggravating factors: motion Relieving factors: rest, heat   PATIENT GOALS: To understand her symptoms and relieve her pain and problems from lateral epicondylitis    OBJECTIVE: (All objective assessments below are from initial evaluation on: 03/22/23 unless otherwise specified.)   HAND DOMINANCE: Right   ADLs: Overall ADLs: States decreased ability to grab, hold household objects, pain and difficulty to  open containers, perform FMS tasks (manipulate fasteners on clothing), mild to moderate bathing problems as well.    FUNCTIONAL OUTCOME MEASURES: 05/10/2023: Patient Specific Functional Scale: ***/10 (typing, driving, lift coffee cup)  (Higher Score  =  Better Ability for the Selected Tasks)     Eval: Patient Specific Functional Scale: 4/10 (typing, driving, lift coffee cup)  (Higher Score  =  Better Ability for the Selected Tasks)      UPPER EXTREMITY ROM     Shoulder to Wrist AROM Right eval Left eval Lt 03/29/23  Lt 04/05/23 Lt 04/13/23 Lt 05/10/23  Forearm supination  74      Forearm pronation   85      Wrist flexion 75 80 83  76 ***  Wrist extension 70 70 66  60 ***  Sh Ir/Er   29*  / 100* 38 / 65 26 / 90 ***/ ***  (Blank rows = not tested)   Hand AROM Left eval  Full Fist Ability (or Gap to Distal Palmar Crease) full  Thumb Opposition  (Kapandji Scale)  10  (Blank rows = not tested)   UPPER EXTREMITY MMT:     MMT Left 03/22/23 Lt 05/10/2023  Elbow flexion 4/5 tender   Elbow extension 4+/5   Forearm supination 4-/5 pain ***  Forearm pronation 4-/5 pain   Wrist flexion 5/5   Wrist extension 4-/5 tender   (Blank rows = not tested)  HAND FUNCTION: 05/10/2023 : Left: *** lbs painful  Eval: Observed weakness in affected Lt hand.  Grip strength Right: 64 lbs, Left: 48 lbs painful  COORDINATION: 03/29/23: Box and Blocks Test: 42 Blocks today (60 is WFL)  OBSERVATIONS:   Eval: tender to Lt lat epi, tender resisted wrist ext test, tender in pronation, sup, and elbow ext- tennis elbow.   TODAY'S TREATMENT:  05/10/2023 : We will f/u in 3 weeks after her family trip, for a progress note with a review of all goals and to determine any more need for therapy. ***    04/19/23: As she has some mild tightness but no significant pain to start, OT does IASTM manual therapy for myofascial release along the left dorsal forearm from ECRB at the wrist to ECRB at the elbow.  She states this feels like a healthy good stretch with no pain and feels looser at the end.  Next, OT gives her compressive wrist wrap to be worn around the elbow similar to a counterforce brace during strengthening today.  She performs several her stretches from her HEP and then is educated on several new shoulder stretches today as she states the left shoulder feels a bit stiff and endrange flexion and abduction.  She is educated on wall climbs in flexion and abduction getting good stretches there as well as cross body  stretches-she is also reminded of internal rotation stretches.  She does tricep and bicep stretches followed by forearm and wrist stretches and gets into the following exercises for whole arm strengthening (as bolded and listed below).  She uses red therapy band for most exercises, and a 5 pound weight for wrist flexion exercises.  She is educated to do wrist extension in an eccentric fashion today and she should continue this for 1 week, and then try, eccentric wrist extension with the same weight-focusing on reps versus increasing weight rapidly.  She states understanding these things, tolerates them all well today and leaves in no significant pain or tenderness.    Exercises (new, whole arm) -Sh wall  climbs & cross-body stretches - Hammer Stretch or Strength   - 2-4 x daily - 1-2 sets - 10-15 reps - Wrist Flexion with Resistance  - 2-4 x daily - 1-2 sets - 10-15 reps - Wrist Extension (eccentric and concentric after 1 week) with Resistance  - 2-4 x daily - 1-2 sets - 10-15 reps - Standing Bicep Curls with Resistance  - 2-4 x daily - 1-2 sets - 10-15 reps - Tricep Kick Back with Resistance  - 2-4 x daily - 1-2 sets - 10-15 reps   PATIENT EDUCATION: Education details: See tx section above for details  Person educated: Patient Education method: Engineer, structural, Teach back, Handouts  Education comprehension: States and demonstrates understanding, Additional Education required    HOME EXERCISE PROGRAM: Access Code: [email or web address] URL: [email or web address] Date: 03/22/2023 Prepared by: Fannie Knee   GOALS: Goals reviewed with patient? Yes   SHORT TERM GOALS: (STG required if POC>30 days) Target Date: 04/09/23  Pt will demo/state understanding of initial HEP to improve pain levels and prerequisite motion. Goal status: 04/13/23: MET   LONG TERM GOALS: Target Date: 05/07/23  Pt will improve functional ability by decreased impairment per PSFS assessment from 4 /10 to  8/10 or better, for better quality of life. Goal status: 05/10/23: ***  2.  Pt will improve grip strength in Lt hand from painful 48lbs to at least 55lbs for functional use at home and in IADLs. Goal status: 05/10/23: ***  3.  Pt will improve A/ROM in Lt FA supination AROM from 74 to at least 80, to have functional motion for tasks like reach and grasp.  Goal status: 05/10/23: ***  4.  Pt will improve strength in Lt wrist ext from painful 4-/5 MMT to at least 4+/5 MMT to have increased functional ability to carry out selfcare and higher-level homecare tasks with less difficulty. Goal status: 05/10/23: ***  5.  Pt will decrease pain at worst from 8/10 to 3/10 or better to have better sleep and occupational participation in daily roles. Goal status: 05/10/23: ***  ASSESSMENT:  CLINICAL IMPRESSION: 05/10/23: ***  04/19/23: She is doing really good with new concentric strengthening and whole arm progressive resistive exercises and activities that we will withhold treatment for 1 to 2 weeks to allow self-care management as well as a family trip that she is planned, and then we will follow-up for progress note and likely discharge if all goals have been met   PLAN:  OT FREQUENCY: 1x/week  OT DURATION: 6 weeks through 05/07/23 as needed  PLANNED INTERVENTIONS: 97168 OT Re-evaluation, 97535 self care/ADL training, 28413 therapeutic exercise, 97530 therapeutic activity, 97112 neuromuscular re-education, 97140 manual therapy, 97035 ultrasound, 97039 fluidotherapy, 97010 moist heat, 97010 cryotherapy, 97033 iontophoresis, 97760 Orthotics management and training, 24401 Subsequent splinting/medication, compression bandaging, Dry needling, coping strategies training, and patient/family education  CONSULTED AND AGREED WITH PLAN OF CARE: Patient  PLAN FOR NEXT SESSION:   ***  Fannie Knee, OTR/L,CHT 05/07/2023, 12:40 PM

## 2023-05-08 DIAGNOSIS — D251 Intramural leiomyoma of uterus: Secondary | ICD-10-CM | POA: Insufficient documentation

## 2023-05-08 DIAGNOSIS — N920 Excessive and frequent menstruation with regular cycle: Secondary | ICD-10-CM | POA: Insufficient documentation

## 2023-05-08 NOTE — Progress Notes (Signed)
GYNECOLOGY  VISIT  CC:   Discuss ultrasound results, menorrhagia  HPI: 47 y.o. G3P2020 Married white female here for discussion of ultrasound results.  Ultrasound obtained due to menorrhagia.  Uterus measured 9.5 x 6.6 x 5.5cm with three fibroids <2cm.  Myomectomy heterogenous with possible adenomyosis.  Enodmetrium 8.39mm.  We have discussed endometrial biopsy and I do think this should be done for evaluation of her bleeding.    We discussed treatment options including oral progesterones, IUD, OCPs, endometrial ablation (which I think has higher failure rater with her due to possible adenomyosis being present) and hysterectomy.  She feels hysterectomy is too aggressive.  Is considering Mirena IUD.  Questions answered.  Wants to discuss with spouse but thinks this is what she is going to plan to do.  Not using anything else for contraception so will plan to place right after next menstrual cycle.  Can do endometrial biopsy that day as well.  Pt comfortable with plan.    Past Medical History:  Diagnosis Date   Anemia    Menorrhagia    Migraines    without aura   Vitamin D deficiency     MEDS:   Current Outpatient Medications on File Prior to Visit  Medication Sig Dispense Refill   acetaminophen (TYLENOL) 325 MG tablet Take 650 mg by mouth every 6 (six) hours as needed.     ibuprofen (ADVIL,MOTRIN) 200 MG tablet Take 200 mg by mouth every 6 (six) hours as needed.     loratadine (CLARITIN) 10 MG tablet Take 10 mg by mouth daily.     meloxicam (MOBIC) 15 MG tablet One tab PO qAM with breakfast for 2 weeks, then daily prn pain. 30 tablet 3   metaxalone (SKELAXIN) 800 MG tablet Take 1 tablet (800 mg total) by mouth 3 (three) times daily as needed for muscle spasms. 30 tablet 1   Multiple Vitamins-Minerals (WOMENS MULTIVITAMIN PO) Take by mouth.     SUMAtriptan (IMITREX) 100 MG tablet Take 1 tablet (100 mg total) by mouth once as needed for up to 1 dose. May repeat in 2 hours if headache persists  or recurs. 10 tablet 11   Current Facility-Administered Medications on File Prior to Visit  Medication Dose Route Frequency Provider Last Rate Last Admin   acetaminophen (TYLENOL) tablet 650 mg  650 mg Oral Once Creig Hines, MD       diphenhydrAMINE (BENADRYL) capsule 25 mg  25 mg Oral Once Creig Hines, MD        ALLERGIES: Patient has no known allergies.  SH:  married, non smoker  Review of Systems  Constitutional: Negative.   Genitourinary:        Menorrhagia    PHYSICAL EXAMINATION:    BP 129/65 (BP Location: Right Arm, Patient Position: Sitting, Cuff Size: Large)   Pulse 85   Wt 166 lb 12.8 oz (75.7 kg)   LMP 04/04/2023   BMI 29.55 kg/m      Physical Exam Constitutional:      Appearance: Normal appearance.  Neurological:     General: No focal deficit present.     Mental Status: She is alert.  Psychiatric:        Mood and Affect: Mood normal.     Assessment/Plan: 1. Menorrhagia with regular cycle - pt is going to proceed with MIrena IUD placement with onset of next cycle.  Will call when this occurs.  She is not using anything else for contraception.   - will plan  endometrial biopsy with next appt as well  2. Intramural and subserous leiomyoma of uterus  3. Iron deficiency anemia due to chronic blood loss  4. Migraine without aura and without status migrainosus, not intractable  Total time with pt:  17 minutes Documentation time: 7 minutes Total time: 24 minutes

## 2023-05-10 ENCOUNTER — Encounter: Payer: BC Managed Care – PPO | Admitting: Rehabilitative and Restorative Service Providers"

## 2023-05-11 ENCOUNTER — Ambulatory Visit (INDEPENDENT_AMBULATORY_CARE_PROVIDER_SITE_OTHER): Payer: BC Managed Care – PPO | Admitting: Rehabilitative and Restorative Service Providers"

## 2023-05-11 ENCOUNTER — Encounter: Payer: Self-pay | Admitting: Rehabilitative and Restorative Service Providers"

## 2023-05-11 DIAGNOSIS — M25522 Pain in left elbow: Secondary | ICD-10-CM | POA: Diagnosis not present

## 2023-05-11 DIAGNOSIS — M6281 Muscle weakness (generalized): Secondary | ICD-10-CM | POA: Diagnosis not present

## 2023-05-11 NOTE — Therapy (Signed)
OUTPATIENT OCCUPATIONAL THERAPY TREATMENT & PROGRESS NOTE  Patient Name: Diane Curry MRN: [id number] DOB:[birth year], 47 y.o., female Today's Date: 05/11/2023  PCP: Shirlee Latch, MD REFERRING PROVIDER: Rodolph Bong, MD       Progress Note Reporting Period 03/22/23 to 05/11/23  05/11/23: For progress note today, she met 3 out of 5 long-term goals and partially met a fourth goal.  Grip strength was significantly weak today likely due to some tenderness to the lateral epicondyle and some hesitancy.  She has been managing on her own for the past 3 weeks and therapy has been somewhat interrupted by holidays and vacation schedules, etc.  She would like to continue therapy to meet her last 2 long-term goals and ensure no exacerbation of pain in the future, but the Christmas and New Year's holidays are "in the way."  She would like to have a brief pause in therapy and return in the new year to ensure that she is conquering this problem.  OT will request up to 5 more visits through July 09, 2023 to help her with this.   See note below for Objective Data and Assessment of Progress/Goals.        END OF SESSION:  OT End of Session - 05/11/23 1433     Visit Number 6    Number of Visits 11    Date for OT Re-Evaluation 07/09/23    Authorization Type [employer]    OT Start Time 1433    OT Stop Time 1511    OT Time Calculation (min) 38 min    Equipment Utilized During Treatment neoprene strapping    Activity Tolerance Patient tolerated treatment well;No increased pain;Patient limited by fatigue;Patient limited by pain    Behavior During Therapy [facility] for tasks assessed/performed              Past Medical History:  Diagnosis Date   Anemia    Menorrhagia    Migraines    without aura   Vitamin D deficiency    Past Surgical History:  Procedure Laterality Date   DILATION AND CURETTAGE OF UTERUS  2013   SAB at 10-12 weeks, warranted PRBC and admission overnight. [place], [place]    harrington rod  1987   PILONIDAL CYST EXCISION  2000   Dr.Byrnette   SPINE SURGERY  1986   Harrington rods for scolosis   TONSILLECTOMY  1992   Patient Active Problem List   Diagnosis Date Noted   Intramural and subserous leiomyoma of uterus 05/08/2023   Menorrhagia with regular cycle 05/08/2023   Lateral epicondylitis, left elbow 03/18/2023   Iron deficiency anemia 05/01/2022   Infantile idiopathic scoliosis of thoracolumbar region 12/30/2018   Overweight 12/17/2017   Migraine 12/26/2016   Absolute anemia 09/03/2015   Avitaminosis D 07/30/2008   Irregular bleeding 07/21/2008   Allergic rhinitis 12/15/2001    ONSET DATE: approx Sept 2024  REFERRING DIAG: M25.522 (ICD-10-CM) - Left elbow pain   THERAPY DIAG:  Pain in left elbow  Muscle weakness (generalized)  Rationale for Evaluation and Treatment: Rehabilitation  PERTINENT HISTORY: Pr MD notes: "left lateral epicondylitis ongoing for about a month. Symptoms are pretty significant rated severe at times. We talked about options. Will try some conservative management with oral diclofenac and Tylenol and home exercise program and referral to Occupational Therapy. If not improving consider steroid injection. Typically this will be done in a month or 6 weeks if not improved. "  She states her elbow started hurting about a month  ago after carrying many books home from a book sale.  She's tried ibprophen and tylenol and is feeling a bit better.  She does have a history of a spinal fusion.  She arrives wearing a prefabricated wrist cock up brace today and also claims to have a counterforce brace which she is only been wearing at night and all night long and has been bothering her.  She works in hospice care, types a lot for work.   PRECAUTIONS: past spinal fusion  RED FLAGS: None;  WEIGHT BEARING RESTRICTIONS: No    SUBJECTIVE:   SUBJECTIVE STATEMENT: She returns after 3 weeks of not being seen due to a family vacation and  states doing fairly well over the past 3 weeks of self-management with no therapy.  She did have an exacerbation the other day when moving some of her relatives items out of the home, and she states forgetting to warm up but at least she did wear her counterforce brace.  Her exacerbation has calm down and she has no significant pain at rest today but some soreness in the past week.     PAIN:  Are you having pain?   Yes: NPRS scale: 0/10 at rest now, up to 3/10 in past week  Pain location: Lt lateral epicondyle Pain description: aching with motion Aggravating factors: motion Relieving factors: rest, heat   PATIENT GOALS: To understand her symptoms and relieve her pain and problems from lateral epicondylitis    OBJECTIVE: (All objective assessments below are from initial evaluation on: 03/22/23 unless otherwise specified.)   HAND DOMINANCE: Right   ADLs: Overall ADLs: States decreased ability to grab, hold household objects, pain and difficulty to open containers, perform FMS tasks (manipulate fasteners on clothing), mild to moderate bathing problems as well.    FUNCTIONAL OUTCOME MEASURES: 05/11/2023: Patient Specific Functional Scale: 8.6/10 (typing, driving, lift coffee cup)  (Higher Score  =  Better Ability for the Selected Tasks)     Eval: Patient Specific Functional Scale: 4/10 (typing, driving, lift coffee cup)  (Higher Score  =  Better Ability for the Selected Tasks)      UPPER EXTREMITY ROM     Shoulder to Wrist AROM Right eval Left eval Lt 05/11/23  Forearm supination  74 88  Forearm pronation   85 85  Wrist flexion 75 80 85  Wrist extension 70 70 75  Sh Ir/Er   36/100  (Blank rows = not tested)   Hand AROM Left eval  Full Fist Ability (or Gap to Distal Palmar Crease) full  Thumb Opposition  (Kapandji Scale)  10  (Blank rows = not tested)   UPPER EXTREMITY MMT:     MMT Left 03/22/23 Lt 05/11/2023  Elbow flexion 4/5 tender 5/5  Elbow extension 4+/5  5/5  Forearm supination 4-/5 pain 5/5  Forearm pronation 4-/5 pain 5/5  Wrist flexion 5/5 5/5  Wrist extension 4-/5 tender 4/5   (Blank rows = not tested)  HAND FUNCTION: 05/11/2023 : Left: 24 lbs  Eval: Observed weakness in affected Lt hand.  Grip strength Right: 64 lbs, Left: 48 lbs painful  COORDINATION: 05/11/23: BBT LT: 54 Blocks today (apprx 60 is WFL)  03/29/23: Box and Blocks Test: 42 Blocks today (60 is WFL)  OBSERVATIONS:   05/11/23: Much less tenderness to the lateral epicondyle now and still has small muscle spasms in the tricep but much better than before.  Manual muscle testing, functional testing and active range of motion are all improved though grip  training shows somewhat weakness and tenderness today.  TODAY'S TREATMENT:  05/11/2023 :   Pt performs AROM, gripping, and strength with left hand and arm against therapist's resistance for exercise/activities as well as new measures today. OT also discusses home and functional tasks with the pt and reviews goals.  Using the complied data, OT also reviews home exercises and continuing to provide recommendations and review of strategies as below. Pt states understanding, but wishes to continue with therapy to ensure no exacerbations and that she can strengthen her grip in her arm to meet all goals in the new year.   Additionally as she still has some sore muscle spasms and tension in the left lateral forearm, she request manual therapy modality dry needling, and OT uses a 0.25 x 30mm needle inserted only partly into the extensor wad of the left forearm achieving several small twitch responses and reduction of pain and stiffness.  She has no significant bruising or bleeding or soreness after this modality and feels better and looser at the end of the session..    Exercises/Activities reviewed today: - Sleeper Stretch  - 3-4 x daily - 5 reps - 15-20 sec hold - Standing neck/upper traps stretch  - 4-6 x daily - 3-5 reps - 15 sec  hold - Tricep Stretch- DO SEATED BY TABLE  - 3-4 x daily - 3-5 reps - 15 hold - Fridge Door Stretch  - 4 x daily - 3-5 reps - 15 sec hold - Forearm Supination Stretch  - 3-4 x daily - 3-5 reps - 15 sec hold - Forearm Pronation Stretch  - 3-4 x daily - 3-5 reps - 15 sec hold - Wrist Flexion Stretch  - 4 x daily - 3-5 reps - 15 sec hold - Wrist Prayer Stretch  - 4 x daily - 3-5 reps - 15 sec hold - Standing Bicep Curls with Resistance  - 2-4 x daily - 1-2 sets - 10-15 reps - Tricep Kick Back with Resistance  - 2-4 x daily - 1-2 sets - 10-15 reps - Hammer Stretch or Strength   - 2-4 x daily - 1-2 sets - 10-15 reps - Wrist Extension with Resistance  - 2-4 x daily - 1-2 sets - 10-15 reps - Wrist Flexion with Resistance  - 2-4 x daily - 1-2 sets - 10-15 reps   PATIENT EDUCATION: Education details: See tx section above for details  Person educated: Patient Education method: Verbal Instruction, Teach back, Handouts  Education comprehension: States and demonstrates understanding, Additional Education required    HOME EXERCISE PROGRAM: Access Code: [email or web address] URL: [email or web address] Date: 03/22/2023 Prepared by: Fannie Knee   GOALS: Goals reviewed with patient? Yes   SHORT TERM GOALS: (STG required if POC>30 days) Target Date: 04/09/23  Pt will demo/state understanding of initial HEP to improve pain levels and prerequisite motion. Goal status: 04/13/23: MET   LONG TERM GOALS: Target Date:  07/09/23  Pt will improve functional ability by decreased impairment per PSFS assessment from 4 /10 to 8/10 or better, for better quality of life. Goal status: 05/11/23: 8.6 MET  2.  Pt will improve grip strength in Lt hand from painful 48lbs to at least 55lbs for functional use at home and in IADLs. Goal status: 05/11/23: NOT MET 24# now, but she was cautious   3.  Pt will improve A/ROM in Lt FA supination AROM from 74 to at least 80, to have functional motion for tasks like  reach and grasp.  Goal status: 05/11/23:  MET 88*   4.  Pt will improve strength in Lt wrist ext from painful 4-/5 MMT to at least 4+/5 MMT to have increased functional ability to carry out selfcare and higher-level homecare tasks with less difficulty. Goal status: 05/11/23: Partially MET 4/5  5.  Pt will decrease pain at worst from 8/10 to 3/10 or better to have better sleep and occupational participation in daily roles. Goal status: 05/11/23: MET  ASSESSMENT:  CLINICAL IMPRESSION: 05/11/23: For progress note today, she met 3 out of 5 long-term goals and partially met a fourth goal.  Grip strength was significantly weak today likely due to some tenderness to the lateral epicondyle and some hesitancy.  She has been managing on her own for the past 3 weeks and therapy has been somewhat interrupted by holidays and vacation schedules, etc.  She would like to continue therapy to meet her last 2 long-term goals and ensure no exacerbation of pain in the future, but the Christmas and New Year's holidays are "in the way."  She would like to have a brief pause in therapy and return in the new year to ensure that she is conquering this problem.  OT will request up to 5 more visits through July 09, 2023 to help her with this.   PLAN:  OT FREQUENCY: 1x/week  OT DURATION: 8 weeks through 07/09/23 as needed and up to 5 more visits (longer time phrame to account for some self-management time around the holidays)   PLANNED INTERVENTIONS: 97168 OT Re-evaluation, 97535 self care/ADL training, 16109 therapeutic exercise, 97530 therapeutic activity, 97112 neuromuscular re-education, 97140 manual therapy, 97035 ultrasound, 97039 fluidotherapy, 97010 moist heat, 97010 cryotherapy, 97033 iontophoresis, 97760 Orthotics management and training, 60454 Subsequent splinting/medication, compression bandaging, Dry needling, coping strategies training, and patient/family education  CONSULTED AND AGREED WITH PLAN OF CARE:  Patient  PLAN FOR NEXT SESSION:   See her back in the new year for a status check briefly followed by review of full HEP and strengthening to try to achieve last remaining goals of grip strength and wrist extension strength while avoiding exacerbation.  Fannie Knee, OTR/L,CHT 05/11/2023, 4:12 PM

## 2023-06-07 ENCOUNTER — Other Ambulatory Visit (HOSPITAL_COMMUNITY)
Admission: RE | Admit: 2023-06-07 | Discharge: 2023-06-07 | Disposition: A | Discharge status: Home / Self Care | Payer: BC Managed Care – PPO | Source: Ambulatory Visit | Attending: Obstetrics & Gynecology | Admitting: Obstetrics & Gynecology

## 2023-06-07 ENCOUNTER — Encounter (HOSPITAL_BASED_OUTPATIENT_CLINIC_OR_DEPARTMENT_OTHER): Payer: Self-pay | Admitting: Obstetrics & Gynecology

## 2023-06-07 ENCOUNTER — Ambulatory Visit (INDEPENDENT_AMBULATORY_CARE_PROVIDER_SITE_OTHER): Payer: BC Managed Care – PPO | Admitting: Obstetrics & Gynecology

## 2023-06-07 VITALS — BP 118/81 | HR 79 | Ht 63.0 in | Wt 166.0 lb

## 2023-06-07 DIAGNOSIS — N920 Excessive and frequent menstruation with regular cycle: Secondary | ICD-10-CM | POA: Insufficient documentation

## 2023-06-07 MED ORDER — NORETHINDRONE 0.35 MG PO TABS: 1.0000 | ORAL_TABLET | Freq: Every day | ORAL | 0 refills | Status: DC

## 2023-06-07 NOTE — Progress Notes (Signed)
 GYNECOLOGY  VISIT  CC:   endometrial biopsy, treatment for menorrhagia  HPI: 48 y.o. [id number] Married   Other or two or more races White or Caucasian female here for endometrial biopsy.  We discussed this as well as IUD use with last ultrasound.  She has decided to not have IUD placed today but is interested in oral progesterone.  Options discussed.  Will start micronor .  Rx will be sent to pharmacy.  Could increase dosage if needed in the future.    No contraception.  UPT negative today.    Past Medical History:  Diagnosis Date   Anemia    Menorrhagia    Migraines    without aura   Vitamin D  deficiency     MEDS:   Current Outpatient Medications on File Prior to Visit  Medication Sig Dispense Refill   acetaminophen  (TYLENOL ) 325 MG tablet Take 650 mg by mouth every 6 (six) hours as needed.     ibuprofen  (ADVIL ,MOTRIN ) 200 MG tablet Take 200 mg by mouth every 6 (six) hours as needed.     loratadine (CLARITIN) 10 MG tablet Take 10 mg by mouth daily.     meloxicam  (MOBIC ) 15 MG tablet One tab PO qAM with breakfast for 2 weeks, then daily prn pain. 30 tablet 3   metaxalone  (SKELAXIN ) 800 MG tablet Take 1 tablet (800 mg total) by mouth 3 (three) times daily as needed for muscle spasms. 30 tablet 1   Multiple Vitamins-Minerals (WOMENS MULTIVITAMIN PO) Take by mouth.     SUMAtriptan  (IMITREX ) 100 MG tablet Take 1 tablet (100 mg total) by mouth once as needed for up to 1 dose. May repeat in 2 hours if headache persists or recurs. 10 tablet 11   Current Facility-Administered Medications on File Prior to Visit  Medication Dose Route Frequency Provider Last Rate Last Admin   acetaminophen  (TYLENOL ) tablet 650 mg  650 mg Oral Once Rao, Archana C, MD       diphenhydrAMINE  (BENADRYL ) capsule 25 mg  25 mg Oral Once Rao, Archana C, MD        ALLERGIES: Patient has no known allergies.  SH:  married, non smoker  Review of Systems  Constitutional: Negative.   Genitourinary:         Menorrhagia    PHYSICAL EXAMINATION:    BP 118/81 (BP Location: Left Arm, Patient Position: Sitting, Cuff Size: Large)   Pulse 79   Ht 5' 3 (1.6 m) Comment: Reported  Wt 166 lb (75.3 kg)   BMI 29.41 kg/m     General appearance: alert, cooperative and appears stated age Abdomen: soft, non-tender; bowel sounds normal; no masses,  no organomegaly  Pelvic: External genitalia:  no lesions              Urethra:  normal appearing urethra with no masses, tenderness or lesions              Bartholins and Skenes: normal                 Vagina: normal mucosa without prolapse or lesions              Cervix: no lesion             Endometrial biopsy recommended.  Discussed with patient.  Verbal and written consent obtained.   Procedure:  Speculum placed.  Cervix visualized and cleansed with betadine prep.  A single toothed tenaculum was applied to the anterior lip of the cervix.  Endometrial  pipelle was advanced through the cervix into the endometrial cavity without difficulty.  Pipelle passed to 8cm.  Suction applied and pipelle removed with good tissue sample obtained.  Tenculum removed.  No bleeding noted.  Patient tolerated procedure well.  Chaperone, Bascom Kotyk, CMA, was present for exam.  Assessment/Plan: 1. Menorrhagia with regular cycle (Primary) - will start micronor  for 3 months.  Side effects discussed. - norethindrone  (MICRONOR ) 0.35 MG tablet; Take 1 tablet (0.35 mg total) by mouth daily.  Dispense: 84 tablet; Refill: 0 - Surgical pathology( [place]/ [facility])

## 2023-06-09 LAB — SURGICAL PATHOLOGY

## 2023-06-10 NOTE — Therapy (Signed)
 OUTPATIENT OCCUPATIONAL THERAPY TREATMENT & DISCHARGE NOTE  Patient Name: Diane Curry MRN: [id number] DOB:[birth year], 48 y.o., female Today's Date: 06/14/2023  PCP: Myrla Slough, MD REFERRING PROVIDER: Joane Artist RAMAN, MD      OCCUPATIONAL THERAPY DISCHARGE SUMMARY  Visits from Start of Care: 7  Current functional level related to goals / functional outcomes: 06/14/23: She's now met all goals except grip strength, which was not painful, and within functional limits.  She also managed her symptoms independently now for the past 3 weeks and had no significant exacerbation of lateral epicondylitis.  She has been mindful and continuing a home exercise program that continues to strengthen her and make improvements.  She was cautioned that her shoulder internal rotation was tight and she should manage this better or this might affect her tricep and lateral epicondyle.  She is a good home exercise program, understands self-care management and will discharge to her own management now.  Education / Equipment: Pt has all needed materials and education. Pt understands how to continue on with self-management. See tx notes for more details.   Patient agrees to discharge due to max benefits received from outpatient occupational therapy / hand therapy at this time.   Melvenia Ada, OTR/L, CHT 06/14/23      END OF SESSION:  OT End of Session - 06/14/23 1527     Visit Number 7    Number of Visits 11    Date for OT Re-Evaluation 07/09/23    Authorization Type BCBS    OT Start Time 1527    OT Stop Time 1607    OT Time Calculation (min) 40 min    Equipment Utilized During Treatment --    Activity Tolerance Patient tolerated treatment well;No increased pain;Patient limited by fatigue    Behavior During Therapy [facility] for tasks assessed/performed             Past Medical History:  Diagnosis Date   Anemia    Menorrhagia    Migraines    without aura   Vitamin D  deficiency     Past Surgical History:  Procedure Laterality Date   DILATION AND CURETTAGE OF UTERUS  2013   SAB at 10-12 weeks, warranted PRBC and admission overnight. [place], [place]   harrington rod  1987   PILONIDAL CYST EXCISION  2000   Dr.Byrnette   SPINE SURGERY  1986   Harrington rods for scolosis   TONSILLECTOMY  1992   Patient Active Problem List   Diagnosis Date Noted   Intramural and subserous leiomyoma of uterus 05/08/2023   Menorrhagia with regular cycle 05/08/2023   Lateral epicondylitis, left elbow 03/18/2023   Iron  deficiency anemia 05/01/2022   Infantile idiopathic scoliosis of thoracolumbar region 12/30/2018   Overweight 12/17/2017   Migraine 12/26/2016   Absolute anemia 09/03/2015   Avitaminosis D 07/30/2008   Irregular bleeding 07/21/2008   Allergic rhinitis 12/15/2001    ONSET DATE: approx Sept 2024  REFERRING DIAG: M25.522 (ICD-10-CM) - Left elbow pain   THERAPY DIAG:  Pain in left elbow  Muscle weakness (generalized)  Rationale for Evaluation and Treatment: Rehabilitation  PERTINENT HISTORY: Pr MD notes: left lateral epicondylitis ongoing for about a month. Symptoms are pretty significant rated severe at times. We talked about options. Will try some conservative management with oral diclofenac and Tylenol  and home exercise program and referral to Occupational Therapy. If not improving consider steroid injection. Typically this will be done in a month or 6 weeks if not improved.   She  states her elbow started hurting about a month ago after carrying many books home from a book sale.  She's tried ibprophen and tylenol  and is feeling a bit better.  She does have a history of a spinal fusion.  She arrives wearing a prefabricated wrist cock up brace today and also claims to have a counterforce brace which she is only been wearing at night and all night long and has been bothering her.  She works in hospice care, types a lot for work.   PRECAUTIONS: past spinal  fusion  RED FLAGS: None;  WEIGHT BEARING RESTRICTIONS: No    SUBJECTIVE:   SUBJECTIVE STATEMENT: She returns after 3 weeks of not being seen with self-management only. She states managing well over the past 3 to 4 weeks, not having any significant flareups of pain but also feeling twinges in the elbow at times.  She admits to creating a habit of keeping her elbow slightly bent rather than extended naturally.    PAIN:  Are you having pain?   Yes: NPRS scale: 0/10 at rest now, up to 2/10 in past week  Pain location: Lt lateral epicondyle Pain description: aching with motion Aggravating factors: motion Relieving factors: rest, heat   PATIENT GOALS: To understand her symptoms and relieve her pain and problems from lateral epicondylitis    OBJECTIVE: (All objective assessments below are from initial evaluation on: 03/22/23 unless otherwise specified.)   HAND DOMINANCE: Right   ADLs: Overall ADLs: States no significant impairments with typical B ADLs and only a few IADLs give her very mild twinges in the left elbow now.   FUNCTIONAL OUTCOME MEASURES: 06/14/23: PSFS: 8.67/10 now (well maintained)   05/11/2023: Patient Specific Functional Scale: 8.6/10 (typing, driving, lift coffee cup)  (Higher Score  =  Better Ability for the Selected Tasks)     Eval: Patient Specific Functional Scale: 4/10 (typing, driving, lift coffee cup)  (Higher Score  =  Better Ability for the Selected Tasks)      UPPER EXTREMITY ROM     Shoulder to Wrist AROM Right eval Left eval Lt 05/11/23 Lt 06/14/23  Forearm supination  74 88   Forearm pronation   85 85   Wrist flexion 75 80 85 88  Wrist extension 70 70 75 76  Sh Ir/Er   36/100 25  (44* after DN)/ 90  (Blank rows = not tested)   Hand AROM Left eval  Full Fist Ability (or Gap to Distal Palmar Crease) full  Thumb Opposition  (Kapandji Scale)  10  (Blank rows = not tested)   UPPER EXTREMITY MMT:     MMT Left 03/22/23  Lt 05/11/2023 Lt 06/14/23  Elbow flexion 4/5 tender 5/5 5/5  Elbow extension 4+/5 5/5 5/5  Forearm supination 4-/5 pain 5/5 4+/5  Forearm pronation 4-/5 pain 5/5 5/5  Wrist flexion 5/5 5/5 5/5  Wrist extension 4-/5 tender 4/5  4+/5   (Blank rows = not tested)  HAND FUNCTION: 06/14/23:  Grip strength Lt: 48#   05/11/2023 : Grip Left: 24 lbs  Eval: Observed weakness in affected Lt hand.  Grip strength Right: 64 lbs, Left: 48 lbs painful  COORDINATION: 06/14/23: 55 Blocks today  05/11/23: BBT LT: 54 Blocks today (apprx 60 is WFL)   OBSERVATIONS:   06/14/23: No tenderness at all to the lateral epicondyle now, some muscle spasms noted just proximally and in the triceps.  Shoulder internal rotation a bit tight still.  Some guarding from pain habits by not allowing  elbow to extend naturally.   TODAY'S TREATMENT:  06/14/23: Pt performs AROM, gripping, and strength with Lt arm against therapist's resistance for exercise/activities as well as new measures today. OT also discusses home and functional tasks with the pt and reviews goals.  She needs all goals today except for grip strength which is actually in functional limits at this point.  To help address her caution with extending her elbow completely, OT advises to continue with self massage to the tricep and lateral epicondyle, continue tricep stretches and wrist flexion stretches , and to actually perform some upgraded strengthening in the farmers carry as well as a task specific training when she lifts her coffee cup and shoulder external rotated position.   OT has her use 3 pound hand weights to do the farmers carry today which is medium stretch for her with no pain and after which she can fully extend her elbow while she walks without any discomfort at all.  Additionally we do task training for her coffee cup exercise with 3 pound hand weight after which she feels some fatigue but no pain and looser in the elbow.    Additionally OT  reviews shoulder internal rotation stretches and its importance as well as offering her manual therapy dry needling modality to this lateral shoulder area and triceps area which is very tight today and spasming.  She agrees to this verbally and OT uses a blank needle inserted only partly into areas of her left arm in the tricep and external rotators insertion.  She gets good twitch responses each time, feels no pain at the end and is improved range of motion as seen listed above.    At the end of the session, she states being able to self manage her problems now, understanding diffuse strengthening upgrades that were done today, feeling better after dry needling and stating understanding her self-care management routines.  Additionally OT tells her to try to break any habits she has of guarding from pain and let her arm swing naturally.    Trigger Point Dry Needling  Subsequent Treatment: Instructions provided previously at initial dry needling treatment.  Instructions reviewed, if requested by the patient, prior to subsequent dry needling treatment.   Patient Verbal Consent Given: Yes Education Handout Provided: Previously Provided Muscles Treated: Lt tricep, deltoid/ER insertion Electrical Stimulation Performed: No Treatment Response/Outcome: Decrease stiffness decrease spasming, no bleeding or bruising no lingering pain     PATIENT EDUCATION: Education details: See tx section above for details  Person educated: Patient Education method: Engineer, Structural, Teach back, Handouts  Education comprehension: States and demonstrates understanding   HOME EXERCISE PROGRAM: Access Code: [email or web address] URL: [email or web address] Date: 03/22/2023 Prepared by: Melvenia Ada   GOALS: Goals reviewed with patient? Yes   SHORT TERM GOALS: (STG required if POC>30 days) Target Date: 04/09/23  Pt will demo/state understanding of initial HEP to improve pain levels and prerequisite  motion. Goal status: 04/13/23: MET   LONG TERM GOALS: Target Date:  07/09/23  Pt will improve functional ability by decreased impairment per PSFS assessment from 4 /10 to 8/10 or better, for better quality of life. Goal status: 05/11/23: 8.6 MET  2.  Pt will improve grip strength in Lt hand from painful 48lbs to at least 55lbs for functional use at home and in IADLs. Goal status: 06/14/23: Now back to 48# (from 24# last time) without pain, goal not met, but no pain or problems and she will continue to work on this herself  3.  Pt will improve A/ROM in Lt FA supination AROM from 74 to at least 80, to have functional motion for tasks like reach and grasp.  Goal status: 05/11/23: MET 88*   4.  Pt will improve strength in Lt wrist ext from painful 4-/5 MMT to at least 4+/5 MMT to have increased functional ability to carry out selfcare and higher-level homecare tasks with less difficulty. Goal status: 06/14/23: Met 05/11/23: Partially MET 4/5  5.  Pt will decrease pain at worst from 8/10 to 3/10 or better to have better sleep and occupational participation in daily roles. Goal status: 05/11/23: MET  ASSESSMENT:  CLINICAL IMPRESSION: 06/14/23: She's now met all goals except grip strength, which was not painful, and within functional limits.  She also managed her symptoms independently now for the past 3 weeks and had no significant exacerbation of lateral epicondylitis.  She has been mindful and continuing a home exercise program that continues to strengthen her and make improvements.  She was cautioned that her shoulder internal rotation was tight and she should manage this better or this might affect her tricep and lateral epicondyle.  She is a good home exercise program, understands self-care management and will discharge to her own management now.   05/11/23: For progress note today, she met 3 out of 5 long-term goals and partially met a fourth goal.  Grip strength was significantly weak today  likely due to some tenderness to the lateral epicondyle and some hesitancy.  She has been managing on her own for the past 3 weeks and therapy has been somewhat interrupted by holidays and vacation schedules, etc.  She would like to continue therapy to meet her last 2 long-term goals and ensure no exacerbation of pain in the future, but the Christmas and New Year's holidays are in the way.  She would like to have a brief pause in therapy and return in the new year to ensure that she is conquering this problem.  OT will request up to 5 more visits through July 09, 2023 to help her with this.   PLAN:  OT FREQUENCY: & OT DURATION: DC  PLANNED INTERVENTIONS: 97168 OT Re-evaluation, 97535 self care/ADL training, 02889 therapeutic exercise, 97530 therapeutic activity, 97112 neuromuscular re-education, 97140 manual therapy, 97035 ultrasound, 97039 fluidotherapy, 97010 moist heat, 97010 cryotherapy, 97033 iontophoresis, 97760 Orthotics management and training, 02236 Subsequent splinting/medication, compression bandaging, Dry needling, coping strategies training, and patient/family education  CONSULTED AND AGREED WITH PLAN OF CARE: Patient  PLAN FOR NEXT SESSION:   N/A, D/C  Melvenia Ada, OTR/L,CHT 06/14/2023, 5:35 PM

## 2023-06-14 ENCOUNTER — Encounter: Payer: Self-pay | Admitting: Rehabilitative and Restorative Service Providers"

## 2023-06-14 ENCOUNTER — Ambulatory Visit (INDEPENDENT_AMBULATORY_CARE_PROVIDER_SITE_OTHER): Payer: BC Managed Care – PPO | Admitting: Rehabilitative and Restorative Service Providers"

## 2023-06-14 DIAGNOSIS — M6281 Muscle weakness (generalized): Secondary | ICD-10-CM | POA: Diagnosis not present

## 2023-06-14 DIAGNOSIS — M25522 Pain in left elbow: Secondary | ICD-10-CM

## 2023-06-14 NOTE — Patient Instructions (Signed)

## 2023-06-29 ENCOUNTER — Encounter: Payer: Self-pay | Admitting: Family Medicine

## 2023-06-29 DIAGNOSIS — G43009 Migraine without aura, not intractable, without status migrainosus: Secondary | ICD-10-CM

## 2023-07-01 NOTE — Telephone Encounter (Signed)
Ok to send sumatriptan as previously Rx'd but for #9 with r11 instead of #10. Thanks!

## 2023-07-02 MED ORDER — SUMATRIPTAN SUCCINATE 100 MG PO TABS: 100.0000 mg | ORAL_TABLET | Freq: Once | ORAL | 11 refills | Status: DC | PRN

## 2023-07-20 ENCOUNTER — Inpatient Hospital Stay: Payer: BC Managed Care – PPO | Admitting: Oncology

## 2023-07-20 ENCOUNTER — Inpatient Hospital Stay: Payer: BC Managed Care – PPO | Attending: Oncology

## 2023-07-20 DIAGNOSIS — D509 Iron deficiency anemia, unspecified: Secondary | ICD-10-CM | POA: Diagnosis not present

## 2023-07-20 LAB — CBC
HCT: 37.2 % (ref 36.0–46.0)
Hemoglobin: 12.2 g/dL (ref 12.0–15.0)
MCH: 28.2 pg (ref 26.0–34.0)
MCHC: 32.8 g/dL (ref 30.0–36.0)
MCV: 85.9 fL (ref 80.0–100.0)
Platelets: 333 10*3/uL (ref 150–400)
RBC: 4.33 MIL/uL (ref 3.87–5.11)
RDW: 13.2 % (ref 11.5–15.5)
WBC: 7.6 10*3/uL (ref 4.0–10.5)
nRBC: 0 % (ref 0.0–0.2)

## 2023-07-20 LAB — IRON AND TIBC
Iron: 64 ug/dL (ref 28–170)
Saturation Ratios: 19 % (ref 10.4–31.8)
TIBC: 346 ug/dL (ref 250–450)
UIBC: 282 ug/dL

## 2023-07-20 LAB — FERRITIN: Ferritin: 26 ng/mL (ref 11–307)

## 2023-07-22 ENCOUNTER — Inpatient Hospital Stay: Payer: BC Managed Care – PPO | Admitting: Oncology

## 2023-07-22 DIAGNOSIS — D509 Iron deficiency anemia, unspecified: Secondary | ICD-10-CM

## 2023-07-22 NOTE — Progress Notes (Signed)
I connected with Diane Curry on 07/22/23 at 10:15 AM EST by video enabled telemedicine visit and verified that I am speaking with the correct person using two identifiers.   I discussed the limitations, risks, security and privacy concerns of performing an evaluation and management service by telemedicine and the availability of in-person appointments. I also discussed with the patient that there may be a patient responsible charge related to this service. The patient expressed understanding and agreed to proceed.  Other persons participating in the visit and their role in the encounter:  none  Patient's location:  home Provider's location:  home  Chief Complaint:  routine f/u of iron deficiency anemia  History of present illness: Patient is a 48 year old female referred for iron deficiency anemia.  Most recent CBC from 04/16/2022 showed white count of 5.4, H&H of 9.7/31.9 with an MCV of 78 and a platelet count of 440.  Iron studies showed ferritin level of 5 with an iron saturation of 8%.  Looking back at her prior CBCs her hemoglobin has been around 10 with chronic microcytosis at least dating back to 2018.   Patient reports  having long-term anemia ever since her menarche.  Her menstrual cycle last for 5 days and first couple of days can be particularly heavy.  She denies any bleeding in her stool or urine.  She has not had a colonoscopy but has been doing stool tests.  Denies any consistent use of NSAIDs.  She gets IV iron through [employer].    Interval history menstrual cycles are still heavy but she was started on minipill recently. She hopes her cycles will get lighter over next couple of months.    Review of Systems  Constitutional:  Negative for chills, fever, malaise/fatigue and weight loss.  HENT:  Negative for congestion, ear discharge and nosebleeds.   Eyes:  Negative for blurred vision.  Respiratory:  Negative for cough, hemoptysis, sputum production, shortness of  breath and wheezing.   Cardiovascular:  Negative for chest pain, palpitations, orthopnea and claudication.  Gastrointestinal:  Negative for abdominal pain, blood in stool, constipation, diarrhea, heartburn, melena, nausea and vomiting.  Genitourinary:  Negative for dysuria, flank pain, frequency, hematuria and urgency.  Musculoskeletal:  Negative for back pain, joint pain and myalgias.  Skin:  Negative for rash.  Neurological:  Negative for dizziness, tingling, focal weakness, seizures, weakness and headaches.  Endo/Heme/Allergies:  Does not bruise/bleed easily.  Psychiatric/Behavioral:  Negative for depression and suicidal ideas. The patient does not have insomnia.     No Known Allergies  Past Medical History:  Diagnosis Date   Anemia    Menorrhagia    Migraines    without aura   Vitamin D deficiency     Past Surgical History:  Procedure Laterality Date   DILATION AND CURETTAGE OF UTERUS  2013   SAB at 10-12 weeks, warranted PRBC and admission overnight. [place], [place]   harrington rod  1987   PILONIDAL CYST EXCISION  2000   Dr.Byrnette   SPINE SURGERY  1986   Harrington rods for scolosis   TONSILLECTOMY  1992    Social History   Socioeconomic History   Marital status: Married    Spouse name: [name]   Number of children: 3   Years of education: 16   Highest education level: Not on file  Occupational History    Comment: [employer]   Occupation: student    Comment: [employer] 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  Tobacco Use  Smoking status: Never   Smokeless tobacco: Never  Vaping Use   Vaping status: Never Used  Substance and Sexual Activity   Alcohol use: Yes    Comment: Once per month; wine or mixed drinks   Drug use: No   Sexual activity: Yes  Other Topics Concern   Not on file  Social History Narrative   Not on file   Social Drivers of Health   Financial Resource Strain: Not on file  Food Insecurity: Not on file  Transportation Needs: Not on  file  Physical Activity: Not on file  Stress: Not on file  Social Connections: Not on file  Intimate Partner Violence: Not on file    Family History  Problem Relation Age of Onset   Osteoporosis Mother    Diabetes Father        non- insulin Dependent   Hypertension Father    Other Sister        produces "extra CSF" that causes visual abnormalities   Hypertension Maternal Grandfather    Heart disease Maternal Grandfather    Breast cancer Paternal Grandmother 17   Cancer Paternal Grandmother    Stroke Paternal Grandfather    Hypertension Paternal Grandfather    Colon cancer Neg Hx    Ovarian cancer Neg Hx    Cervical cancer Neg Hx      Current Outpatient Medications:    acetaminophen (TYLENOL) 325 MG tablet, Take 650 mg by mouth every 6 (six) hours as needed., Disp: , Rfl:    ibuprofen (ADVIL,MOTRIN) 200 MG tablet, Take 200 mg by mouth every 6 (six) hours as needed., Disp: , Rfl:    loratadine (CLARITIN) 10 MG tablet, Take 10 mg by mouth daily., Disp: , Rfl:    meloxicam (MOBIC) 15 MG tablet, One tab PO qAM with breakfast for 2 weeks, then daily prn pain., Disp: 30 tablet, Rfl: 3   metaxalone (SKELAXIN) 800 MG tablet, Take 1 tablet (800 mg total) by mouth 3 (three) times daily as needed for muscle spasms., Disp: 30 tablet, Rfl: 1   Multiple Vitamins-Minerals (WOMENS MULTIVITAMIN PO), Take by mouth., Disp: , Rfl:    norethindrone (MICRONOR) 0.35 MG tablet, Take 1 tablet (0.35 mg total) by mouth daily., Disp: 84 tablet, Rfl: 0   SUMAtriptan (IMITREX) 100 MG tablet, Take 1 tablet (100 mg total) by mouth once as needed for up to 1 dose. May repeat in 2 hours if headache persists or recurs., Disp: 9 tablet, Rfl: 11 No current facility-administered medications for this visit.  Facility-Administered Medications Ordered in Other Visits:    acetaminophen (TYLENOL) tablet 650 mg, 650 mg, Oral, Once, Creig Hines, MD   diphenhydrAMINE (BENADRYL) capsule 25 mg, 25 mg, Oral, Once, Creig Hines, MD  No results found.  No images are attached to the encounter.      Latest Ref Rng & Units 04/20/2023    8:41 AM  CMP  Glucose 70 - 99 mg/dL 91   BUN 6 - 24 mg/dL 11   Creatinine 1.61 - 1.00 mg/dL 0.96   Sodium 045 - 409 mmol/L 140   Potassium 3.5 - 5.2 mmol/L 4.1   Chloride 96 - 106 mmol/L 104   CO2 20 - 29 mmol/L 21   Calcium 8.7 - 10.2 mg/dL 9.1   Total Protein 6.0 - 8.5 g/dL 6.4   Total Bilirubin 0.0 - 1.2 mg/dL 0.2   Alkaline Phos 44 - 121 IU/L 52   AST 0 - 40 IU/L 18  ALT 0 - 32 IU/L 23       Latest Ref Rng & Units 07/20/2023   12:47 PM  CBC  WBC 4.0 - 10.5 K/uL 7.6   Hemoglobin 12.0 - 15.0 g/dL 16.1   Hematocrit 09.6 - 46.0 % 37.2   Platelets 150 - 400 K/uL 333      Observation/objective:appears in no acute distress over video visit today. Breathing is non labored.  Assessment and plan:Patient is a 48 year old with h/o iron deficiency anemia  Patient last received IV iron in October 2024.  She is overall asymptomatic at this time in hopes that her menstrual cycles will lighten after starting minipill recently.  She is not anemic with an H&H of 12.2/27.2.  Ferritin  however did drop from 1 21-26.  She would like to wait on getting any more IV iron at this time.  CBC ferritin and iron studies in 3 and 6 months and I will see her back in 6 months.  She also plans to get her screening colonoscopy done performed of this year.  Iron deficiency anemia likely secondary to menorrhagia.  Follow-up instructions:as above  I discussed the assessment and treatment plan with the patient. The patient was provided an opportunity to ask questions and all were answered. The patient agreed with the plan and demonstrated an understanding of the instructions.   The patient was advised to call back or seek an in-person evaluation if the symptoms worsen or if the condition fails to improve as anticipated.  I provided 11 minutes of face-to-face video visit time during this  encounter, and > 50% was spent counseling as documented under my assessment & plan.  Visit Diagnosis: 1. Iron deficiency anemia, unspecified iron deficiency anemia type     Dr. Owens Shark, MD, MPH [facility] Tel- [phone] 07/22/2023 5:16 PM

## 2023-09-01 ENCOUNTER — Other Ambulatory Visit (HOSPITAL_BASED_OUTPATIENT_CLINIC_OR_DEPARTMENT_OTHER): Payer: Self-pay | Admitting: Obstetrics & Gynecology

## 2023-09-01 DIAGNOSIS — N920 Excessive and frequent menstruation with regular cycle: Secondary | ICD-10-CM

## 2023-09-01 NOTE — Telephone Encounter (Signed)
 Called patient to inquire about requesting this medication. Patient states she did not, but had intentions on calling or sending a message anyway. Patient feels like the medication is not working. She said her last period was just as heavy as her periods were before she started the medication. She is currently mid cycle. If you think that things are going to improve, then she is willing to continue taking it. If you say this is about as good as it gets she would like to stop it. Please advise. tbw

## 2023-09-02 NOTE — Telephone Encounter (Signed)
 Please let pt know typically takes three cycles to see how much bleeding will improve with new pill.  She was given this in January so is likely in her third pack.  Fine to stop.  Will decline refill with pharmacy.  We had originally discussed IUD.  OK to proceed with this if she desires.

## 2023-09-02 NOTE — Telephone Encounter (Signed)
 LMOM at 11:08 for patient to call office to receive below information per Dr. Hyacinth Meeker. tbw

## 2023-09-03 NOTE — Telephone Encounter (Signed)
 LMOM at 9:09 for patient to call the office to receive updated information per Dr. Hyacinth Meeker. tbw

## 2023-09-14 ENCOUNTER — Other Ambulatory Visit: Payer: Self-pay

## 2023-09-14 ENCOUNTER — Ambulatory Visit: Payer: Self-pay | Admitting: Adult Health

## 2023-09-14 ENCOUNTER — Encounter: Payer: Self-pay | Admitting: Adult Health

## 2023-09-14 VITALS — BP 127/86 | HR 75 | Temp 98.6°F | Ht 63.0 in | Wt 160.0 lb

## 2023-09-14 DIAGNOSIS — J029 Acute pharyngitis, unspecified: Secondary | ICD-10-CM

## 2023-09-14 DIAGNOSIS — R051 Acute cough: Secondary | ICD-10-CM

## 2023-09-14 LAB — POC SOFIA 2 FLU + SARS ANTIGEN FIA
Influenza A, POC: NEGATIVE
Influenza B, POC: NEGATIVE
SARS Coronavirus 2 Ag: NEGATIVE

## 2023-09-14 MED ORDER — AMOXICILLIN-POT CLAVULANATE 875-125 MG PO TABS: 1.0000 | ORAL_TABLET | Freq: Two times a day (BID) | ORAL | 0 refills | Status: DC

## 2023-09-14 MED ORDER — FLUCONAZOLE 150 MG PO TABS: ORAL_TABLET | ORAL | 0 refills | Status: DC

## 2023-09-14 MED ORDER — ALBUTEROL SULFATE HFA 108 (90 BASE) MCG/ACT IN AERS: INHALATION_SPRAY | RESPIRATORY_TRACT | 0 refills | Status: DC

## 2023-09-14 NOTE — Progress Notes (Signed)
 [facility] [address] [place], [place] [address]   Office Visit Note  Patient Name: Diane Curry Date of Birth [id number]  Medical Record number [id number]  Date of Service: 09/14/2023  Chief Complaint  Patient presents with   Acute Visit    Patient c/o sore throat, postnasal drainage, nasal congestion, and a wet cough. Symptoms began 5 days ago. She has been taking Tylenol and Advil throughout the day and Nyquil at bedtime.      HPI Pt is here for a sick visit. She reports a few days of sinus drainage, sore throat, cough, headache, sinus pressure and fatigue. She also felt feverish one day. Denies chills, NVD   Current Medication:  Outpatient Encounter Medications as of 09/14/2023  Medication Sig Note   acetaminophen (TYLENOL) 325 MG tablet Take 650 mg by mouth every 6 (six) hours as needed.    ibuprofen (ADVIL,MOTRIN) 200 MG tablet Take 200 mg by mouth every 6 (six) hours as needed.    INCASSIA 0.35 MG tablet TAKE 1 TABLET BY MOUTH EVERY DAY    loratadine (CLARITIN) 10 MG tablet Take 10 mg by mouth daily.    meloxicam (MOBIC) 15 MG tablet One tab PO qAM with breakfast for 2 weeks, then daily prn pain.    metaxalone (SKELAXIN) 800 MG tablet Take 1 tablet (800 mg total) by mouth 3 (three) times daily as needed for muscle spasms.    Multiple Vitamins-Minerals (WOMENS MULTIVITAMIN PO) Take by mouth. 04/12/2023: Not regularly   SUMAtriptan (IMITREX) 100 MG tablet Take 1 tablet (100 mg total) by mouth once as needed for up to 1 dose. May repeat in 2 hours if headache persists or recurs.    Facility-Administered Encounter Medications as of 09/14/2023  Medication   acetaminophen (TYLENOL) tablet 650 mg   diphenhydrAMINE (BENADRYL) capsule 25 mg      Medical History: Past Medical History:  Diagnosis Date   Anemia    Menorrhagia    Migraines    without aura   Vitamin D deficiency      Vital Signs: BP 127/86   Pulse 75   Temp 98.6 F (37 C)   Ht 5\' 3"   (1.6 m)   Wt 160 lb (72.6 kg)   SpO2 96%   BMI 28.34 kg/m    Review of Systems  Constitutional:  Positive for fatigue.  HENT:  Positive for congestion, sinus pressure and sore throat.   Eyes:  Negative for pain and redness.  Respiratory:  Negative for cough.   Cardiovascular:  Negative for chest pain.  Gastrointestinal:  Negative for diarrhea, nausea and vomiting.  Neurological:  Positive for headaches.    Physical Exam Vitals reviewed.  HENT:     Head: Normocephalic.     Right Ear: Tympanic membrane and ear canal normal.     Left Ear: Tympanic membrane and ear canal normal.     Nose: Nose normal.     Mouth/Throat:     Mouth: Mucous membranes are moist.  Pulmonary:     Effort: Pulmonary effort is normal.     Breath sounds: Rhonchi present.  Lymphadenopathy:     Cervical: No cervical adenopathy.    Results for orders placed or performed in visit on 09/14/23 (from the past 24 hours)  POC SOFIA 2 FLU + SARS ANTIGEN FIA     Status: None   Collection Time: 09/14/23 10:30 AM  Result Value Ref Range   Influenza A, POC Negative Negative   Influenza  B, POC Negative Negative   SARS Coronavirus 2 Ag Negative Negative    Assessment/Plan: 1. Acute cough (Primary) Take meds as discussed.  - POC SOFIA 2 FLU + SARS ANTIGEN FIA - albuterol (VENTOLIN HFA) 108 (90 Base) MCG/ACT inhaler; Inhale 2 puffs three times daily for one week.  Then use 2 puffs every 4-6 hours as needed for cough  Dispense: 8 g; Refill: 0 - amoxicillin-clavulanate (AUGMENTIN) 875-125 MG tablet; Take 1 tablet by mouth 2 (two) times daily.  Dispense: 20 tablet; Refill: 0 - fluconazole (DIFLUCAN) 150 MG tablet; Take one tablet by mouth at onset of symptoms, may repeat in 5 days if symptoms persist.  Dispense: 2 tablet; Refill: 0  2. Sore throat - POC SOFIA 2 FLU + SARS ANTIGEN FIA        General Counseling: Diane Curry understanding of the findings of todays visit and agrees with plan of treatment. I have  discussed any further diagnostic evaluation that may be needed or ordered today. We also reviewed her medications today. she has been encouraged to call the office with any questions or concerns that should arise related to todays visit.   Orders Placed This Encounter  Procedures   POC SOFIA 2 FLU + SARS ANTIGEN FIA    No orders of the defined types were placed in this encounter.   Time spent:15 Minutes    Sheria Dills AGNP-C Nurse Practitioner

## 2023-10-19 ENCOUNTER — Inpatient Hospital Stay: Payer: BC Managed Care – PPO | Attending: Oncology

## 2023-10-19 ENCOUNTER — Ambulatory Visit: Payer: Self-pay | Admitting: Oncology

## 2023-10-19 DIAGNOSIS — D509 Iron deficiency anemia, unspecified: Secondary | ICD-10-CM | POA: Insufficient documentation

## 2023-10-19 LAB — CBC
HCT: 33 % — ABNORMAL LOW (ref 36.0–46.0)
Hemoglobin: 10.6 g/dL — ABNORMAL LOW (ref 12.0–15.0)
MCH: 27.4 pg (ref 26.0–34.0)
MCHC: 32.1 g/dL (ref 30.0–36.0)
MCV: 85.3 fL (ref 80.0–100.0)
Platelets: 346 10*3/uL (ref 150–400)
RBC: 3.87 MIL/uL (ref 3.87–5.11)
RDW: 14 % (ref 11.5–15.5)
WBC: 7 10*3/uL (ref 4.0–10.5)
nRBC: 0 % (ref 0.0–0.2)

## 2023-10-19 LAB — IRON AND TIBC
Iron: 62 ug/dL (ref 28–170)
Saturation Ratios: 17 % (ref 10.4–31.8)
TIBC: 360 ug/dL (ref 250–450)
UIBC: 298 ug/dL

## 2023-10-19 LAB — FERRITIN: Ferritin: 8 ng/mL — ABNORMAL LOW (ref 11–307)

## 2023-10-26 ENCOUNTER — Inpatient Hospital Stay

## 2023-10-28 ENCOUNTER — Telehealth: Payer: Self-pay | Admitting: Oncology

## 2023-10-28 NOTE — Telephone Encounter (Signed)
 Patient called to let us  know [facility] [place]. She states a nurse set those up for her last time. She is asking for a call back.   Please call  [phone]  Also she requested to cancel the future infusions. Appointments cancelled

## 2023-10-29 ENCOUNTER — Other Ambulatory Visit: Payer: Self-pay | Admitting: Oncology

## 2023-10-29 ENCOUNTER — Inpatient Hospital Stay

## 2023-10-29 ENCOUNTER — Telehealth: Payer: Self-pay

## 2023-10-29 NOTE — Telephone Encounter (Signed)
 I entered the orders and changed the location to [facility]. Can you check if it looks right?

## 2023-10-29 NOTE — Telephone Encounter (Signed)
 Per secure chat message received [facility] [place] is where the patient would like to have future infusions moving forward.  Forwarded note to Dr. Randy Buttery to obtain orders and will follow up with Lorayne Rocks and Thyra Flower accordingly.

## 2023-11-01 ENCOUNTER — Telehealth: Payer: Self-pay

## 2023-11-01 ENCOUNTER — Encounter: Payer: Self-pay | Admitting: Oncology

## 2023-11-01 ENCOUNTER — Inpatient Hospital Stay

## 2023-11-01 ENCOUNTER — Other Ambulatory Visit: Payer: Self-pay | Admitting: Oncology

## 2023-11-01 NOTE — Telephone Encounter (Signed)
 Teldrin RpH stated they the orders for [address] are in the supportive care section of oncology navigator. He'll transcribe it over to chimg therapy plan so that Krista can see it.  Lovette Rud confirmed she was able to see the orders on her end now.

## 2023-11-01 NOTE — Telephone Encounter (Signed)
 Created in error. Please disregard.

## 2023-11-01 NOTE — Telephone Encounter (Signed)
 Auth Submission: NO AUTH NEEDED Site of care: Site of care: [facility] Payer: [employer] comm Medication & CPT/J Code(s) submitted: Venofer  (Iron  Sucrose) J1756 Route of submission (phone, fax, portal): portal Phone # Fax # Auth type: Buy/Bill PB Units/visits requested: 200mg  x 5 doses Reference number:  Approval from: 11/01/23 to 05/31/24

## 2023-11-04 ENCOUNTER — Ambulatory Visit (INDEPENDENT_AMBULATORY_CARE_PROVIDER_SITE_OTHER)

## 2023-11-04 VITALS — BP 114/79 | HR 73 | Temp 98.3°F | Resp 14 | Ht 63.0 in | Wt 168.8 lb

## 2023-11-04 DIAGNOSIS — D508 Other iron deficiency anemias: Secondary | ICD-10-CM

## 2023-11-04 DIAGNOSIS — D509 Iron deficiency anemia, unspecified: Secondary | ICD-10-CM

## 2023-11-04 MED ORDER — IRON SUCROSE 20 MG/ML IV SOLN
200.0000 mg | Freq: Once | INTRAVENOUS | Status: AC
  Administered 2023-11-04: 200 mg via INTRAVENOUS
  Filled 2023-11-04: qty 10

## 2023-11-04 NOTE — Progress Notes (Signed)
 Diagnosis: Iron  Deficiency Anemia  Provider:  Mannam, Praveen MD  Procedure: IV Push  IV Type: Peripheral, IV Location: L Antecubital  Venofer  (Iron  Sucrose), Dose: 200 mg  Post Infusion IV Care: Observation period completed and Peripheral IV Discontinued. 10 minute observation per patient request.  Discharge: Condition: Stable, Destination: Home . AVS Declined  Performed by:  Lendel Quant, RN

## 2023-11-05 ENCOUNTER — Inpatient Hospital Stay

## 2023-11-08 ENCOUNTER — Inpatient Hospital Stay

## 2023-11-08 ENCOUNTER — Ambulatory Visit

## 2023-11-08 VITALS — BP 112/76 | HR 81 | Temp 98.5°F | Resp 18 | Ht 63.0 in | Wt 170.0 lb

## 2023-11-08 DIAGNOSIS — D508 Other iron deficiency anemias: Secondary | ICD-10-CM

## 2023-11-08 DIAGNOSIS — D509 Iron deficiency anemia, unspecified: Secondary | ICD-10-CM | POA: Diagnosis not present

## 2023-11-08 MED ORDER — SODIUM CHLORIDE 0.9 % IV BOLUS
250.0000 mL | Freq: Once | INTRAVENOUS | Status: AC
  Administered 2023-11-08: 250 mL via INTRAVENOUS
  Filled 2023-11-08: qty 250

## 2023-11-08 MED ORDER — IRON SUCROSE 20 MG/ML IV SOLN
200.0000 mg | Freq: Once | INTRAVENOUS | Status: AC
  Administered 2023-11-08: 200 mg via INTRAVENOUS
  Filled 2023-11-08: qty 10

## 2023-11-08 NOTE — Progress Notes (Signed)
 Diagnosis: Iron Deficiency Anemia  Provider:  Chilton Greathouse MD  Procedure: IV Push  IV Type: Peripheral, IV Location: L Antecubital  Venofer (Iron Sucrose), Dose: 200 mg  Post Infusion IV Care: Patient declined observation and Peripheral IV Discontinued  Discharge: Condition: Good, Destination: Home . AVS Declined  Performed by:  Rico Ala, LPN

## 2023-11-09 ENCOUNTER — Encounter: Payer: Self-pay | Admitting: Oncology

## 2023-11-10 ENCOUNTER — Ambulatory Visit

## 2023-11-10 VITALS — BP 112/73 | HR 80 | Temp 98.3°F | Resp 16 | Ht 63.0 in | Wt 169.8 lb

## 2023-11-10 DIAGNOSIS — D509 Iron deficiency anemia, unspecified: Secondary | ICD-10-CM | POA: Diagnosis not present

## 2023-11-10 DIAGNOSIS — D508 Other iron deficiency anemias: Secondary | ICD-10-CM

## 2023-11-10 MED ORDER — SODIUM CHLORIDE 0.9 % IV BOLUS
250.0000 mL | Freq: Once | INTRAVENOUS | Status: AC
  Administered 2023-11-10: 250 mL via INTRAVENOUS
  Filled 2023-11-10: qty 250

## 2023-11-10 MED ORDER — IRON SUCROSE 20 MG/ML IV SOLN
200.0000 mg | Freq: Once | INTRAVENOUS | Status: AC
  Administered 2023-11-10: 200 mg via INTRAVENOUS
  Filled 2023-11-10: qty 10

## 2023-11-10 NOTE — Progress Notes (Signed)
 Diagnosis: Iron  Deficiency Anemia  Provider:  Praveen Mannam MD  Procedure: IV Push  IV Type: Peripheral, IV Location: R Antecubital  Venofer  (Iron  Sucrose), Dose: 200 mg  Post Infusion IV Care: Observation period completed and Peripheral IV Discontinued only stayed 10 minutes of observation   Discharge: Condition: Good, Destination: Home . AVS Declined  Performed by:  Star [place], LPN

## 2023-11-15 ENCOUNTER — Ambulatory Visit

## 2023-11-15 VITALS — BP 117/77 | HR 84 | Temp 98.7°F | Resp 16 | Ht 63.0 in | Wt 171.2 lb

## 2023-11-15 DIAGNOSIS — D509 Iron deficiency anemia, unspecified: Secondary | ICD-10-CM | POA: Diagnosis not present

## 2023-11-15 DIAGNOSIS — D508 Other iron deficiency anemias: Secondary | ICD-10-CM

## 2023-11-15 MED ORDER — SODIUM CHLORIDE 0.9 % IV BOLUS
250.0000 mL | Freq: Once | INTRAVENOUS | Status: AC
  Administered 2023-11-15: 250 mL via INTRAVENOUS
  Filled 2023-11-15: qty 250

## 2023-11-15 MED ORDER — IRON SUCROSE 20 MG/ML IV SOLN
200.0000 mg | Freq: Once | INTRAVENOUS | Status: AC
  Administered 2023-11-15: 200 mg via INTRAVENOUS
  Filled 2023-11-15: qty 10

## 2023-11-15 NOTE — Progress Notes (Signed)
 Diagnosis: Iron Deficiency Anemia  Provider:  Chilton Greathouse MD  Procedure: IV Push  IV Type: Peripheral, IV Location: L Antecubital  Venofer (Iron Sucrose), Dose: 200 mg  Post Infusion IV Care: Observation period completed and Peripheral IV Discontinued  Discharge: Condition: Good, Destination: Home . AVS Declined  Performed by:  Rico Ala, LPN

## 2023-11-18 ENCOUNTER — Ambulatory Visit

## 2023-11-22 ENCOUNTER — Ambulatory Visit (INDEPENDENT_AMBULATORY_CARE_PROVIDER_SITE_OTHER)

## 2023-11-22 VITALS — BP 118/77 | HR 82 | Temp 98.2°F | Resp 18 | Ht 63.0 in | Wt 171.0 lb

## 2023-11-22 DIAGNOSIS — D509 Iron deficiency anemia, unspecified: Secondary | ICD-10-CM | POA: Diagnosis not present

## 2023-11-22 DIAGNOSIS — D508 Other iron deficiency anemias: Secondary | ICD-10-CM

## 2023-11-22 MED ORDER — IRON SUCROSE 20 MG/ML IV SOLN
200.0000 mg | Freq: Once | INTRAVENOUS | Status: AC
  Administered 2023-11-22: 200 mg via INTRAVENOUS
  Filled 2023-11-22: qty 10

## 2023-11-22 MED ORDER — SODIUM CHLORIDE 0.9 % IV BOLUS
250.0000 mL | Freq: Once | INTRAVENOUS | Status: AC
  Administered 2023-11-22: 250 mL via INTRAVENOUS
  Filled 2023-11-22: qty 250

## 2023-11-22 NOTE — Progress Notes (Signed)
 Diagnosis: Iron Deficiency Anemia  Provider:  Chilton Greathouse MD  Procedure: IV Push  IV Type: Peripheral, IV Location: L Antecubital  Venofer (Iron Sucrose), Dose: 200 mg  Post Infusion IV Care: Patient declined observation and Peripheral IV Discontinued  Discharge: Condition: Good, Destination: Home . AVS Declined  Performed by:  Garnette Czech, RN

## 2024-01-11 IMAGING — MG MM DIGITAL SCREENING BILAT W/ TOMO AND CAD
6 of 10 series · 6 of 30 positions shown · non-contrast
Comparison: Previous exam(s).

CLINICAL DATA: Screening.

EXAM:
DIGITAL SCREENING BILATERAL MAMMOGRAM WITH TOMOSYNTHESIS AND CAD
TECHNIQUE: Bilateral screening digital craniocaudal and mediolateral oblique
mammograms were obtained. Bilateral screening digital breast
tomosynthesis was performed. The images were evaluated with
computer-aided detection.

[L MLO synth-2D]
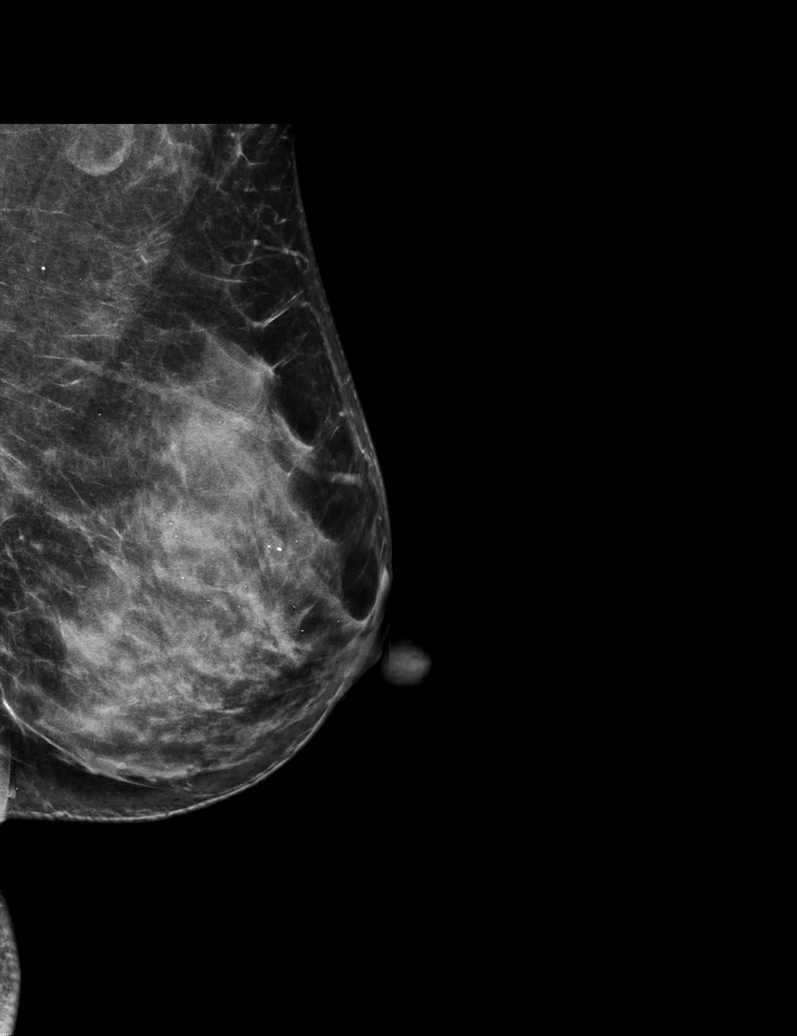

[L CC synth-2D]
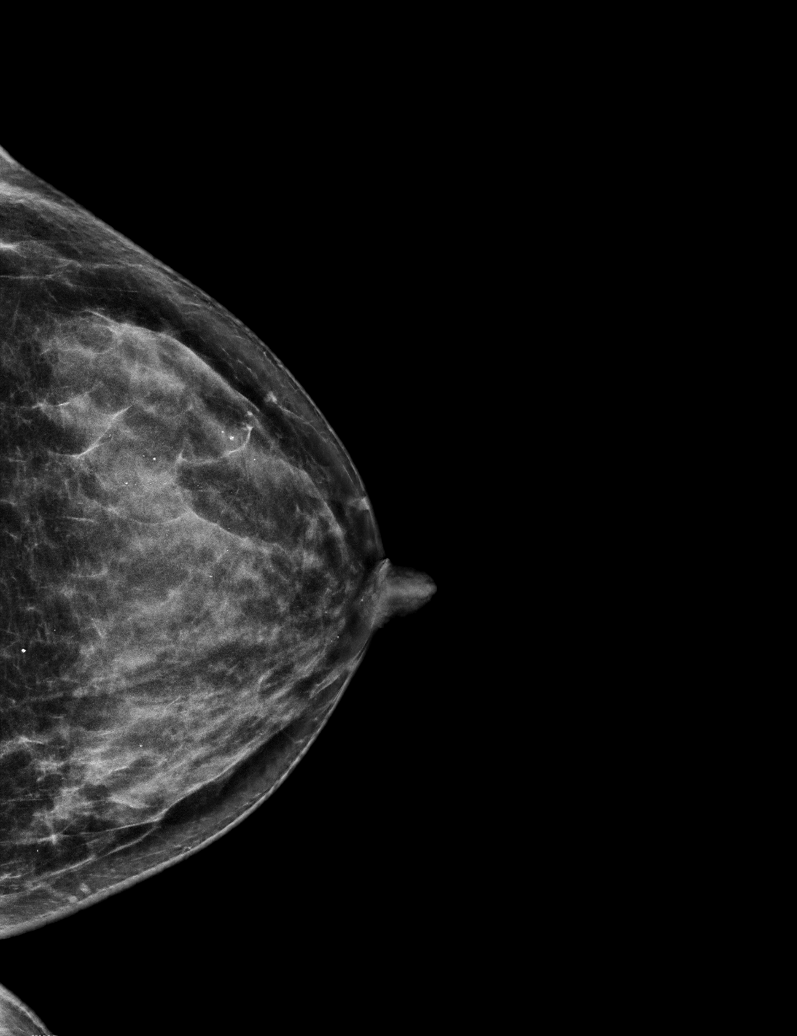

[R MLO synth-2D (1 of 2)]
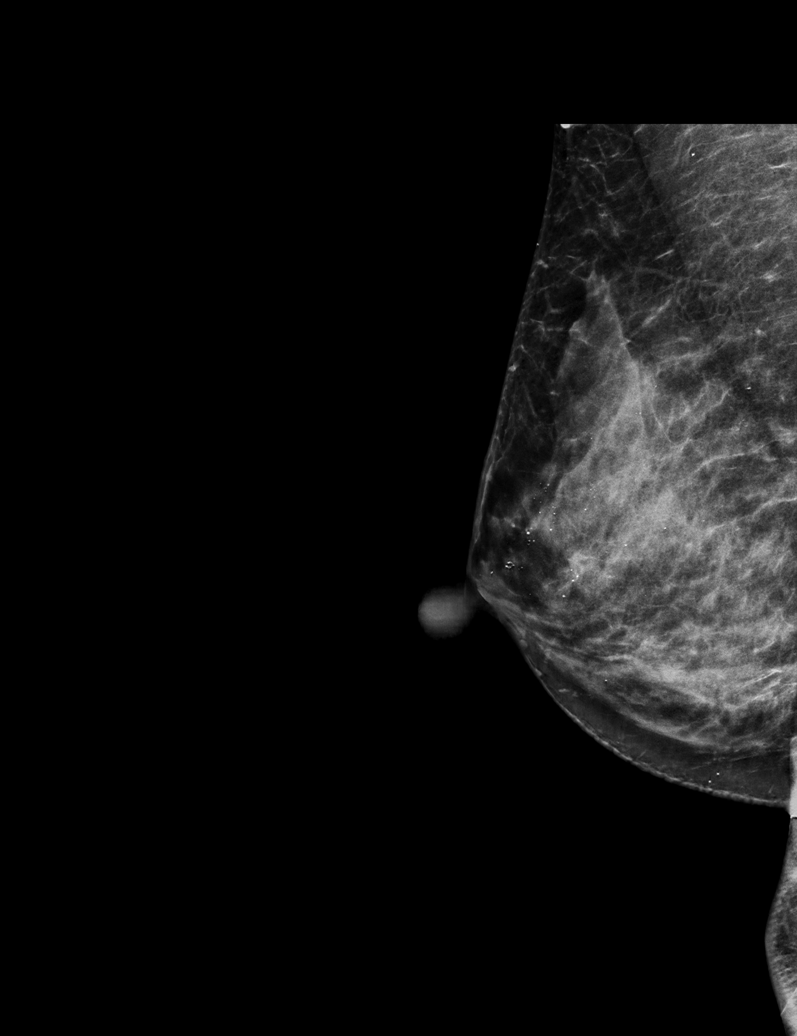

[R CC synth-2D]
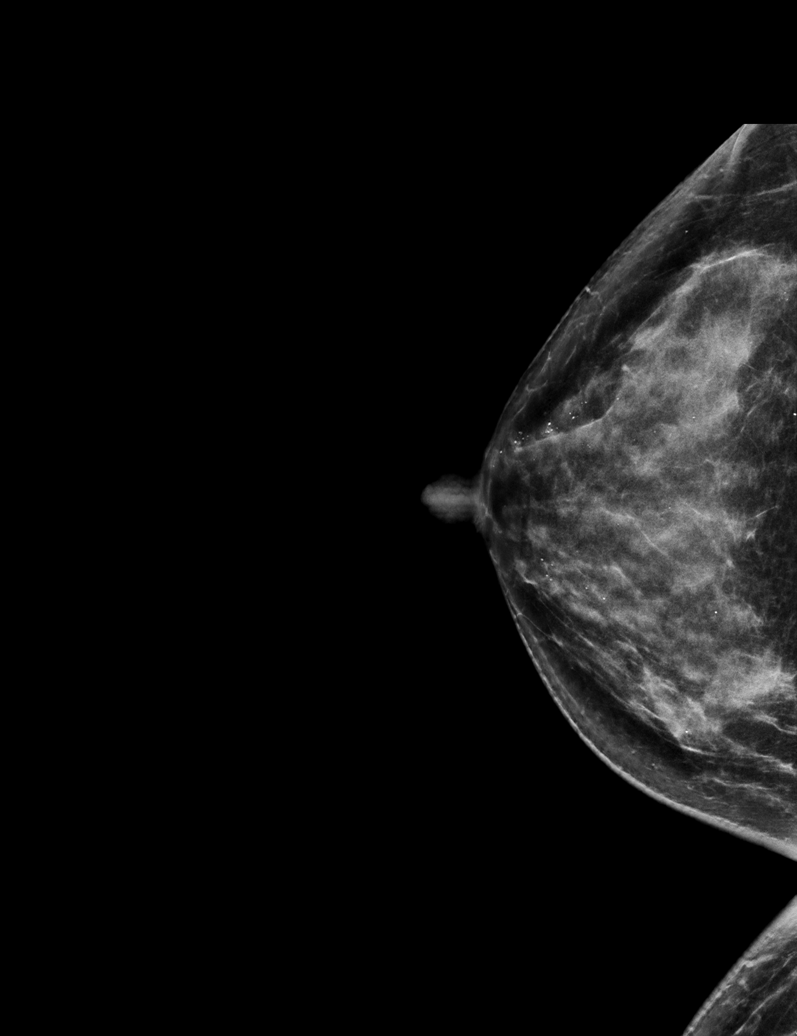

[R MLO synth-2D (2 of 2)]
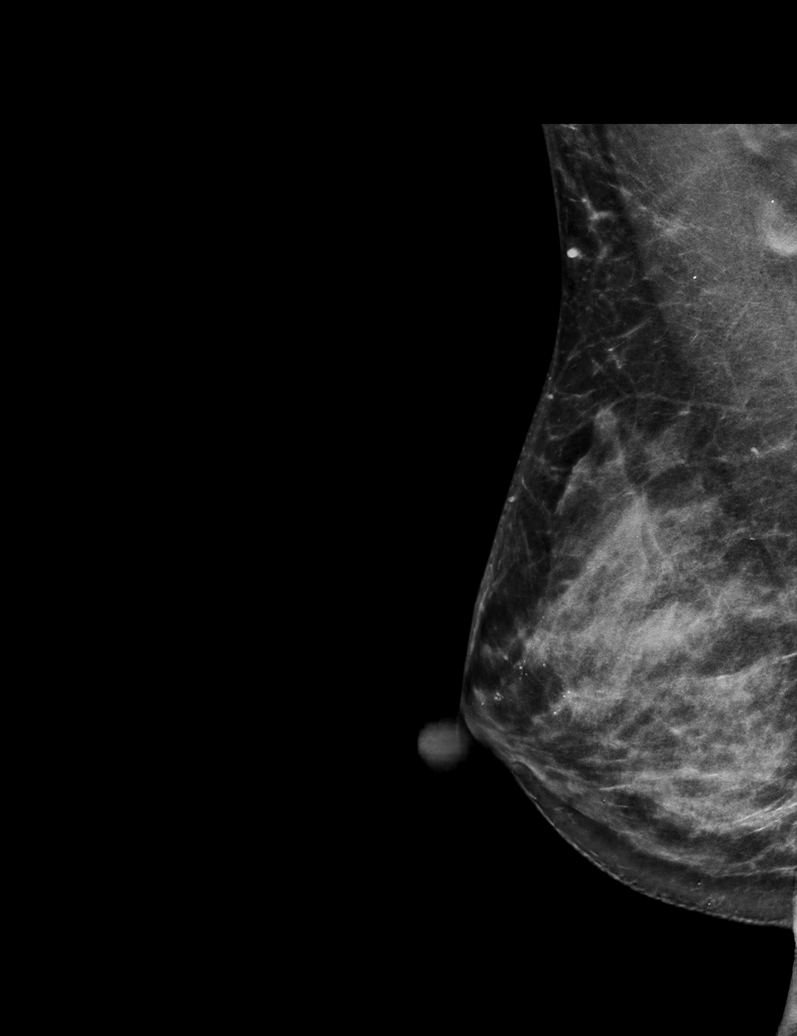

[R MLO tomo · tomo slice 32/63.0]
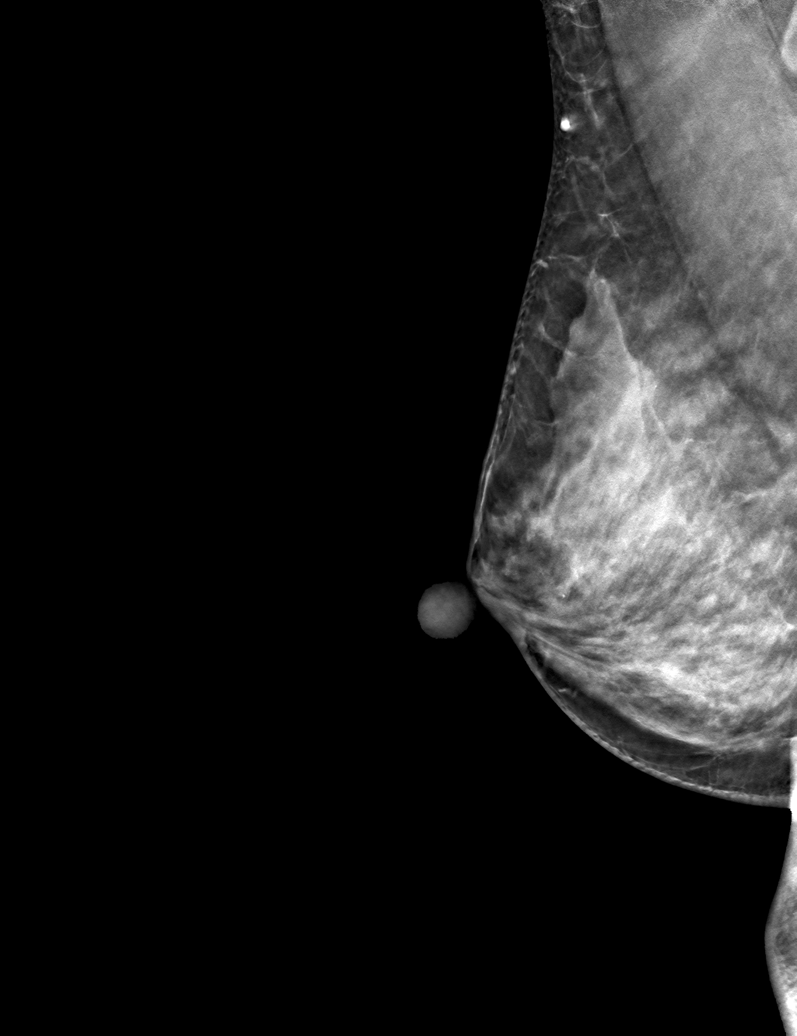

[6 of 30 positions shown; findings below may reference images not displayed]

ACR Breast Density Category c: The breast tissue is heterogeneously
dense, which may obscure small masses.
FINDINGS: There are no findings suspicious for malignancy.
IMPRESSION: No mammographic evidence of malignancy. A result letter of this
screening mammogram will be mailed directly to the patient.

RECOMMENDATION:
Screening mammogram in one year. (Code:Q3-W-BC3)

BI-RADS CATEGORY  1: Negative.

## 2024-01-19 ENCOUNTER — Inpatient Hospital Stay: Payer: BC Managed Care – PPO | Admitting: Oncology

## 2024-01-19 ENCOUNTER — Inpatient Hospital Stay: Payer: BC Managed Care – PPO

## 2024-01-20 ENCOUNTER — Other Ambulatory Visit: Payer: Self-pay | Admitting: *Deleted

## 2024-01-20 DIAGNOSIS — D509 Iron deficiency anemia, unspecified: Secondary | ICD-10-CM

## 2024-01-21 ENCOUNTER — Inpatient Hospital Stay: Attending: Oncology

## 2024-01-21 DIAGNOSIS — D509 Iron deficiency anemia, unspecified: Secondary | ICD-10-CM | POA: Insufficient documentation

## 2024-01-21 LAB — CBC WITH DIFFERENTIAL/PLATELET
Abs Immature Granulocytes: 0.03 K/uL (ref 0.00–0.07)
Basophils Absolute: 0 K/uL (ref 0.0–0.1)
Basophils Relative: 0 %
Eosinophils Absolute: 0.1 K/uL (ref 0.0–0.5)
Eosinophils Relative: 1 %
HCT: 38 % (ref 36.0–46.0)
Hemoglobin: 12.3 g/dL (ref 12.0–15.0)
Immature Granulocytes: 0 %
Lymphocytes Relative: 25 %
Lymphs Abs: 2 K/uL (ref 0.7–4.0)
MCH: 27.6 pg (ref 26.0–34.0)
MCHC: 32.4 g/dL (ref 30.0–36.0)
MCV: 85.4 fL (ref 80.0–100.0)
Monocytes Absolute: 0.4 K/uL (ref 0.1–1.0)
Monocytes Relative: 5 %
Neutro Abs: 5.4 K/uL (ref 1.7–7.7)
Neutrophils Relative %: 69 %
Platelets: 332 K/uL (ref 150–400)
RBC: 4.45 MIL/uL (ref 3.87–5.11)
RDW: 13.6 % (ref 11.5–15.5)
WBC: 7.9 K/uL (ref 4.0–10.5)
nRBC: 0 % (ref 0.0–0.2)

## 2024-01-21 LAB — IRON AND TIBC
Iron: 88 ug/dL (ref 28–170)
Saturation Ratios: 31 % (ref 10.4–31.8)
TIBC: 288 ug/dL (ref 250–450)
UIBC: 200 ug/dL

## 2024-01-21 LAB — FERRITIN: Ferritin: 130 ng/mL (ref 11–307)

## 2024-01-24 ENCOUNTER — Inpatient Hospital Stay (HOSPITAL_BASED_OUTPATIENT_CLINIC_OR_DEPARTMENT_OTHER): Admitting: Oncology

## 2024-01-24 ENCOUNTER — Encounter: Payer: Self-pay | Admitting: Oncology

## 2024-01-24 VITALS — BP 121/85 | HR 76 | Temp 98.4°F | Resp 19 | Ht 63.0 in | Wt 162.4 lb

## 2024-01-24 DIAGNOSIS — D509 Iron deficiency anemia, unspecified: Secondary | ICD-10-CM | POA: Diagnosis not present

## 2024-01-24 NOTE — Progress Notes (Signed)
 Hematology/Oncology Consult note [facility]  Telephone:([phone] [phone]  Patient Care Team: Myrla Jon HERO, MD as PCP - General (Family Medicine) Melanee Annah BROCKS, MD as Consulting Physician (Oncology)   Name of the patient: Diane Curry  [id number]  [birth year]   Date of visit: 01/24/24  Diagnosis-iron  deficiency anemia  Chief complaint/ Reason for visit-routine follow-up of iron  deficiency anemia  Heme/Onc history: Patient is a 48 year old female referred for iron  deficiency anemia.  Most recent CBC from 04/16/2022 showed white count of 5.4, H&H of 9.7/31.9 with an MCV of 78 and a platelet count of 440.  Iron  studies showed ferritin level of 5 with an iron  saturation of 8%.  Looking back at her prior CBCs her hemoglobin has been around 10 with chronic microcytosis at least dating back to 2018.   Patient reports  having long-term anemia ever since her menarche.  Her menstrual cycle last for 5 days and first couple of days can be particularly heavy.  She denies any bleeding in her stool or urine.  She has not had a colonoscopy but has been doing stool tests.  Denies any consistent use of NSAIDs.  She gets IV iron  through [employer].    Interval history-patient reports some symptoms of cough especially after she eats.  She is in the process of seeing GI to get evaluated for reflux.  She was on minipill to control her menstrual cycles but that did not work for her so she stopped taking it.  Menstrual cycles are regular but at times can be heavy  ECOG PS- 0 Pain scale- 0   Review of systems- Review of Systems  Constitutional:  Negative for chills, fever, malaise/fatigue and weight loss.  HENT:  Negative for congestion, ear discharge and nosebleeds.   Eyes:  Negative for blurred vision.  Respiratory:  Negative for cough, hemoptysis, sputum production, shortness of breath and wheezing.   Cardiovascular:  Negative for chest pain,  palpitations, orthopnea and claudication.  Gastrointestinal:  Negative for abdominal pain, blood in stool, constipation, diarrhea, heartburn, melena, nausea and vomiting.  Genitourinary:  Negative for dysuria, flank pain, frequency, hematuria and urgency.  Musculoskeletal:  Negative for back pain, joint pain and myalgias.  Skin:  Negative for rash.  Neurological:  Negative for dizziness, tingling, focal weakness, seizures, weakness and headaches.  Endo/Heme/Allergies:  Does not bruise/bleed easily.  Psychiatric/Behavioral:  Negative for depression and suicidal ideas. The patient does not have insomnia.       No Known Allergies   Past Medical History:  Diagnosis Date   Anemia    Menorrhagia    Migraines    without aura   Vitamin D  deficiency      Past Surgical History:  Procedure Laterality Date   DILATION AND CURETTAGE OF UTERUS  2013   SAB at 10-12 weeks, warranted PRBC and admission overnight. [place], [place]   harrington rod  1987   PILONIDAL CYST EXCISION  2000   Dr.Byrnette   SPINE SURGERY  1986   Harrington rods for scolosis   TONSILLECTOMY  1992    Social History   Socioeconomic History   Marital status: Married    Spouse name: [name]   Number of children: 3   Years of education: 16   Highest education level: Not on file  Occupational History    Comment:    Occupation: student    Comment: [employer] 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  Tobacco Use  Smoking status: Never   Smokeless tobacco: Never  Vaping Use   Vaping status: Never Used  Substance and Sexual Activity   Alcohol use: Yes    Comment: Once per month; wine or mixed drinks   Drug use: No   Sexual activity: Yes  Other Topics Concern   Not on file  Social History Narrative   Not on file   Social Drivers of Health   Financial Resource Strain: Not on file  Food Insecurity: Not on file  Transportation Needs: Not on file  Physical Activity: Not on file  Stress: Not on file   Social Connections: Not on file  Intimate Partner Violence: Not on file    Family History  Problem Relation Age of Onset   Osteoporosis Mother    Diabetes Father        non- insulin Dependent   Hypertension Father    Other Sister        produces extra CSF that causes visual abnormalities   Hypertension Maternal Grandfather    Heart disease Maternal Grandfather    Breast cancer Paternal Grandmother 41   Cancer Paternal Grandmother    Stroke Paternal Grandfather    Hypertension Paternal Grandfather    Colon cancer Neg Hx    Ovarian cancer Neg Hx    Cervical cancer Neg Hx      Current Outpatient Medications:    acetaminophen  (TYLENOL ) 325 MG tablet, Take 650 mg by mouth every 6 (six) hours as needed., Disp: , Rfl:    ibuprofen  (ADVIL ,MOTRIN ) 200 MG tablet, Take 200 mg by mouth every 6 (six) hours as needed., Disp: , Rfl:    loratadine (CLARITIN) 10 MG tablet, Take 10 mg by mouth daily., Disp: , Rfl:    meloxicam  (MOBIC ) 15 MG tablet, One tab PO qAM with breakfast for 2 weeks, then daily prn pain., Disp: 30 tablet, Rfl: 3   metaxalone  (SKELAXIN ) 800 MG tablet, Take 1 tablet (800 mg total) by mouth 3 (three) times daily as needed for muscle spasms., Disp: 30 tablet, Rfl: 1   Multiple Vitamins-Minerals (WOMENS MULTIVITAMIN PO), Take by mouth., Disp: , Rfl:    SUMAtriptan  (IMITREX ) 100 MG tablet, Take 1 tablet (100 mg total) by mouth once as needed for up to 1 dose. May repeat in 2 hours if headache persists or recurs., Disp: 9 tablet, Rfl: 11   albuterol  (VENTOLIN  HFA) 108 (90 Base) MCG/ACT inhaler, Inhale 2 puffs three times daily for one week.  Then use 2 puffs every 4-6 hours as needed for cough, Disp: 8 g, Rfl: 0   amoxicillin -clavulanate (AUGMENTIN ) 875-125 MG tablet, Take 1 tablet by mouth 2 (two) times daily., Disp: 20 tablet, Rfl: 0   fluconazole  (DIFLUCAN ) 150 MG tablet, Take one tablet by mouth at onset of symptoms, may repeat in 5 days if symptoms persist. (Patient not  taking: Reported on 01/24/2024), Disp: 2 tablet, Rfl: 0   INCASSIA  0.35 MG tablet, TAKE 1 TABLET BY MOUTH EVERY DAY (Patient not taking: Reported on 01/24/2024), Disp: 84 tablet, Rfl: 0 No current facility-administered medications for this visit.  Facility-Administered Medications Ordered in Other Visits:    acetaminophen  (TYLENOL ) tablet 650 mg, 650 mg, Oral, Once, Melanee Annah BROCKS, MD   diphenhydrAMINE  (BENADRYL ) capsule 25 mg, 25 mg, Oral, Once, Melanee Annah BROCKS, MD  Physical exam:  Vitals:   01/24/24 1307  BP: 121/85  Pulse: 76  Resp: 19  Temp: 98.4 F (36.9 C)  TempSrc: Tympanic  SpO2: 98%  Weight: 162 lb  6.4 oz (73.7 kg)  Height: 5' 3 (1.6 m)   Physical Exam Cardiovascular:     Rate and Rhythm: Normal rate and regular rhythm.     Heart sounds: Normal heart sounds.  Pulmonary:     Effort: Pulmonary effort is normal.     Breath sounds: Normal breath sounds.  Skin:    General: Skin is warm and dry.  Neurological:     Mental Status: She is alert and oriented to person, place, and time.      I have personally reviewed labs listed below:    Latest Ref Rng & Units 04/20/2023    8:41 AM  CMP  Glucose 70 - 99 mg/dL 91   BUN 6 - 24 mg/dL 11   Creatinine 9.42 - 1.00 mg/dL 9.27   Sodium 865 - 855 mmol/L 140   Potassium 3.5 - 5.2 mmol/L 4.1   Chloride 96 - 106 mmol/L 104   CO2 20 - 29 mmol/L 21   Calcium 8.7 - 10.2 mg/dL 9.1   Total Protein 6.0 - 8.5 g/dL 6.4   Total Bilirubin 0.0 - 1.2 mg/dL 0.2   Alkaline Phos 44 - 121 IU/L 52   AST 0 - 40 IU/L 18   ALT 0 - 32 IU/L 23       Latest Ref Rng & Units 01/21/2024   12:51 PM  CBC  WBC 4.0 - 10.5 K/uL 7.9   Hemoglobin 12.0 - 15.0 g/dL 87.6   Hematocrit 63.9 - 46.0 % 38.0   Platelets 150 - 400 K/uL 332      Assessment and plan- Patient is a 48 y.o. female here for routine follow-up of iron  deficiency anemia  Patient last received IV Iron  3 months ago.  Her hemoglobin is presently improved from 10.6-12.3.  Ferritin  levels are more than 50 and iron  studies are within normal limits.  She does not require any IV iron  at this time.  CBC ferritin and iron  studies in 3 and 6 months and I will see her back in 6 months   Visit Diagnosis 1. Iron  deficiency anemia, unspecified iron  deficiency anemia type      Dr. Annah Skene, MD, MPH [facility] [phone] 01/24/2024 1:24 PM

## 2024-01-24 NOTE — Progress Notes (Signed)
 Patient has no new or acute concerns at this time, doing well.

## 2024-02-08 ENCOUNTER — Encounter: Payer: Self-pay | Admitting: Family Medicine

## 2024-02-08 ENCOUNTER — Other Ambulatory Visit: Payer: Self-pay

## 2024-02-08 DIAGNOSIS — E663 Overweight: Secondary | ICD-10-CM

## 2024-02-23 ENCOUNTER — Other Ambulatory Visit: Payer: Self-pay | Admitting: Family Medicine

## 2024-02-23 DIAGNOSIS — Z1231 Encounter for screening mammogram for malignant neoplasm of breast: Secondary | ICD-10-CM

## 2024-03-17 ENCOUNTER — Ambulatory Visit
Admission: RE | Admit: 2024-03-17 | Discharge: 2024-03-17 | Disposition: A | Discharge status: Home / Self Care | Source: Ambulatory Visit | Attending: Family Medicine | Admitting: Family Medicine

## 2024-03-17 DIAGNOSIS — Z1231 Encounter for screening mammogram for malignant neoplasm of breast: Secondary | ICD-10-CM | POA: Diagnosis not present

## 2024-03-21 NOTE — Progress Notes (Deleted)
 Referring Physician:  Myrla Jon HERO, MD [address] [place],  [place] [address]  Primary Physician:  Myrla Jon HERO, MD  History of Present Illness: 03/21/2024*** [name] has a history of ***  Scoliosis-thoracolumbar region.  Duration: *** Location: *** Quality: *** Severity: ***  Precipitating: aggravated by *** Modifying factors: made better by *** Weakness: none Timing: ***  Tobacco use: smokes *** PPD x years. Does not smoke.   Bowel/Bladder Dysfunction: none  Conservative measures:  Physical therapy: *** Has participated in @[facility] . Multimodal medical therapy including regular antiinflammatories: *** acetaminophen , meloxicam ,  Injections: *** epidural steroid injections?  Past Surgery: ***  Diane Curry has ***no symptoms of cervical myelopathy.  The symptoms are causing a significant impact on the patient's life.   Review of Systems:  A 10 point review of systems is negative, except for the pertinent positives and negatives detailed in the HPI.  Past Medical History: Past Medical History:  Diagnosis Date   Anemia    Menorrhagia    Migraines    without aura   Vitamin D  deficiency     Past Surgical History: Past Surgical History:  Procedure Laterality Date   DILATION AND CURETTAGE OF UTERUS  2013   SAB at 10-12 weeks, warranted PRBC and admission overnight. [place], [place]   harrington rod  1987   PILONIDAL CYST EXCISION  2000   Dr.Byrnette   SPINE SURGERY  1986   Harrington rods for scolosis   TONSILLECTOMY  1992    Allergies: Allergies as of 03/27/2024   (No Known Allergies)    Medications: Outpatient Encounter Medications as of 03/27/2024  Medication Sig   acetaminophen  (TYLENOL ) 325 MG tablet Take 650 mg by mouth every 6 (six) hours as needed.   albuterol  (VENTOLIN  HFA) 108 (90 Base) MCG/ACT inhaler Inhale 2 puffs three times daily for one week.  Then use 2 puffs every 4-6 hours as needed for cough    amoxicillin -clavulanate (AUGMENTIN ) 875-125 MG tablet Take 1 tablet by mouth 2 (two) times daily.   fluconazole  (DIFLUCAN ) 150 MG tablet Take one tablet by mouth at onset of symptoms, may repeat in 5 days if symptoms persist. (Patient not taking: Reported on 01/24/2024)   ibuprofen  (ADVIL ,MOTRIN ) 200 MG tablet Take 200 mg by mouth every 6 (six) hours as needed.   INCASSIA  0.35 MG tablet TAKE 1 TABLET BY MOUTH EVERY DAY (Patient not taking: Reported on 01/24/2024)   loratadine (CLARITIN) 10 MG tablet Take 10 mg by mouth daily.   meloxicam  (MOBIC ) 15 MG tablet One tab PO qAM with breakfast for 2 weeks, then daily prn pain.   metaxalone  (SKELAXIN ) 800 MG tablet Take 1 tablet (800 mg total) by mouth 3 (three) times daily as needed for muscle spasms.   Multiple Vitamins-Minerals (WOMENS MULTIVITAMIN PO) Take by mouth.   SUMAtriptan  (IMITREX ) 100 MG tablet Take 1 tablet (100 mg total) by mouth once as needed for up to 1 dose. May repeat in 2 hours if headache persists or recurs.   Facility-Administered Encounter Medications as of 03/27/2024  Medication   acetaminophen  (TYLENOL ) tablet 650 mg   diphenhydrAMINE  (BENADRYL ) capsule 25 mg    Social History: Social History   Tobacco Use   Smoking status: Never   Smokeless tobacco: Never  Vaping Use   Vaping status: Never Used  Substance Use Topics   Alcohol use: Yes    Comment: Once per month; wine or mixed drinks   Drug use: No  Family Medical History: Family History  Problem Relation Age of Onset   Osteoporosis Mother    Diabetes Father        non- insulin Dependent   Hypertension Father    Other Sister        produces extra CSF that causes visual abnormalities   Hypertension Maternal Grandfather    Heart disease Maternal Grandfather    Breast cancer Paternal Grandmother 46   Cancer Paternal Grandmother    Stroke Paternal Grandfather    Hypertension Paternal Grandfather    Colon cancer Neg Hx    Ovarian cancer Neg Hx     Cervical cancer Neg Hx     Physical Examination: There were no vitals filed for this visit.  General: Patient is well developed, well nourished, calm, collected, and in no apparent distress. Attention to examination is appropriate.  Respiratory: Patient is breathing without any difficulty.   NEUROLOGICAL:     Awake, alert, oriented to person, place, and time.  Speech is clear and fluent. Fund of knowledge is appropriate.   Cranial Nerves: Pupils equal round and reactive to light.  Facial tone is symmetric.    *** ROM of cervical spine *** pain *** posterior cervical tenderness. *** tenderness in bilateral trapezial region.   *** ROM of lumbar spine *** pain *** posterior lumbar tenderness.   No abnormal lesions on exposed skin.   Strength: Side Biceps Triceps Deltoid Interossei Grip Wrist Ext. Wrist Flex.  R 5 5 5 5 5 5 5   L 5 5 5 5 5 5 5    Side Iliopsoas Quads Hamstring PF DF EHL  R 5 5 5 5 5 5   L 5 5 5 5 5 5    Reflexes are ***2+ and symmetric at the biceps, brachioradialis, patella and achilles.   Hoffman's is absent.  Clonus is not present.   Bilateral upper and lower extremity sensation is intact to light touch.     Gait is normal.   ***No difficulty with tandem gait.    Medical Decision Making  Imaging: ***  I have personally reviewed the images and agree with the above interpretation.  Assessment and Plan: Diane Curry is a pleasant 47 y.o. female has ***  Treatment options discussed with patient and following plan made:   - Order for physical therapy for *** spine ***. Patient to call to schedule appointment. *** - Continue current medications including ***. Reviewed dosing and side effects.  - Prescription for ***. Reviewed dosing and side effects. Take with food.  - Prescription for *** to take prn muscle spasms. Reviewed dosing and side effects. Discussed this can cause drowsiness.  - MRI of *** to further evaluate *** radiculopathy. No improvement  time or medications (***).  - Referral to PMR at [facility] to discuss possible *** injections.  - Will schedule phone visit to review MRI results once I get them back.   I spent a total of *** minutes in face-to-face and non-face-to-face activities related to this patient's care today including review of outside records, review of imaging, review of symptoms, physical exam, discussion of differential diagnosis, discussion of treatment options, and documentation.   Thank you for involving me in the care of this patient.   Glade Boys PA-C Dept. of Neurosurgery

## 2024-03-22 DIAGNOSIS — Z7189 Other specified counseling: Secondary | ICD-10-CM | POA: Diagnosis not present

## 2024-03-22 DIAGNOSIS — N951 Menopausal and female climacteric states: Secondary | ICD-10-CM | POA: Diagnosis not present

## 2024-03-23 ENCOUNTER — Ambulatory Visit: Payer: Self-pay | Admitting: Family Medicine

## 2024-03-27 ENCOUNTER — Ambulatory Visit: Admitting: Orthopedic Surgery

## 2024-04-04 ENCOUNTER — Ambulatory Visit: Admitting: Gastroenterology

## 2024-04-04 ENCOUNTER — Other Ambulatory Visit: Payer: Self-pay | Admitting: *Deleted

## 2024-04-04 ENCOUNTER — Encounter: Payer: Self-pay | Admitting: *Deleted

## 2024-04-04 ENCOUNTER — Other Ambulatory Visit: Payer: Self-pay

## 2024-04-04 ENCOUNTER — Encounter: Payer: Self-pay | Admitting: Oncology

## 2024-04-04 VITALS — BP 123/79 | HR 71 | Temp 99.1°F | Ht 63.0 in | Wt 161.0 lb

## 2024-04-04 DIAGNOSIS — R1310 Dysphagia, unspecified: Secondary | ICD-10-CM

## 2024-04-04 DIAGNOSIS — Z1211 Encounter for screening for malignant neoplasm of colon: Secondary | ICD-10-CM

## 2024-04-04 DIAGNOSIS — D509 Iron deficiency anemia, unspecified: Secondary | ICD-10-CM | POA: Diagnosis not present

## 2024-04-04 DIAGNOSIS — K219 Gastro-esophageal reflux disease without esophagitis: Secondary | ICD-10-CM | POA: Diagnosis not present

## 2024-04-04 MED ORDER — PANTOPRAZOLE SODIUM 40 MG PO TBEC
40.0000 mg | DELAYED_RELEASE_TABLET | Freq: Every day | ORAL | 3 refills | Status: AC
Start: 2024-04-04 — End: ?
  Filled 2024-04-04: qty 90, 90d supply, fill #0

## 2024-04-04 MED ORDER — CLENPIQ 10-3.5-12 MG-GM -GM/175ML PO SOLN
1.0000 | ORAL | 0 refills | Status: AC
  Filled 2024-04-04: qty 350, 1d supply, fill #0

## 2024-04-04 NOTE — Addendum Note (Signed)
 Addended by: GAYLENE MADELIN CROME on: 04/04/2024 01:19 PM   Modules accepted: Orders

## 2024-04-04 NOTE — Progress Notes (Signed)
 Gastroenterology Office Note    Referring Provider: Myrla Jon HERO, MD Primary Care Physician:  Myrla Jon HERO, MD  Primary GI: Dr. Shaaron    Chief Complaint   Chief Complaint  Patient presents with   New Patient (Initial Visit)    Pt having symptoms of reflux for years. She is beginning to notice more. She has a cough after eating     History of Present Illness   Zamyah Wiesman Lafuente is a 48 y.o. female presenting today at the request of Bacigalupo, Jon HERO, MD due to chronic GERD now worsening. No prior colonoscopy, but she had a negative Cologuard in 2022. She would like to arrange average risk screening colonoscopy as well.    GERD: for past 6-7 years, postprandial cough after lunch. Occasional burning in esophagus but triggered by certain foods, less than once a month. Does intermittent fasting, lunch is first meal. Cough 15-50 minutes. Feels mucus like. Clears throat. Doesn't notice much at dinner.   No abdominal pain. No N/V. Tried taking omeprazole OTC in the past and thinks it helped some but wasn't routine with it daily.   Solid food dysphagia with potatoes and bread. Dry textures more difficult. Will have to drink more water. Chunked potatoes can be difficult or thicker steak fry.   Long-standing hx of IDA. Ferritin May 2025 8. Recently ferritin 130. Has heavier periods. Both mom and maternal grandmother struggled with anemia. Not candidate for endometrial ablation. Has had iron  infusions about onces a year. Celiac negative a year ago.   H.pylori stool antigen a year ago: positive, s/p treatment  No changes in bowel habits  NSAIDs just prn, not often.   No prior colonoscopy. Cologuard negative in 2022. Planned to do colonoscopy at age 37.      Past Medical History:  Diagnosis Date   Anemia    Menorrhagia    Migraines    without aura   Vitamin D  deficiency     Past Surgical History:  Procedure Laterality Date   DILATION AND CURETTAGE OF  UTERUS  2013   SAB at 10-12 weeks, warranted PRBC and admission overnight. [place], [place]   harrington rod  1987   PILONIDAL CYST EXCISION  2000   Dr.Byrnette   SPINE SURGERY  1986   Harrington rods for scolosis   TONSILLECTOMY  1992    Current Outpatient Medications  Medication Sig Dispense Refill   acetaminophen  (TYLENOL ) 325 MG tablet Take 650 mg by mouth every 6 (six) hours as needed.     ibuprofen  (ADVIL ,MOTRIN ) 200 MG tablet Take 200 mg by mouth every 6 (six) hours as needed.     loratadine (CLARITIN) 10 MG tablet Take 10 mg by mouth daily.     metaxalone  (SKELAXIN ) 800 MG tablet Take 1 tablet (800 mg total) by mouth 3 (three) times daily as needed for muscle spasms. 30 tablet 1   Multiple Vitamins-Minerals (WOMENS MULTIVITAMIN PO) Take by mouth.     SUMAtriptan  (IMITREX ) 100 MG tablet Take 1 tablet (100 mg total) by mouth once as needed for up to 1 dose. May repeat in 2 hours if headache persists or recurs. 9 tablet 11   albuterol  (VENTOLIN  HFA) 108 (90 Base) MCG/ACT inhaler Inhale 2 puffs three times daily for one week.  Then use 2 puffs every 4-6 hours as needed for cough 8 g 0   amoxicillin -clavulanate (AUGMENTIN ) 875-125 MG tablet Take 1 tablet by mouth 2 (two) times daily. 20 tablet 0   fluconazole  (  DIFLUCAN ) 150 MG tablet Take one tablet by mouth at onset of symptoms, may repeat in 5 days if symptoms persist. (Patient not taking: Reported on 01/24/2024) 2 tablet 0   INCASSIA  0.35 MG tablet TAKE 1 TABLET BY MOUTH EVERY DAY (Patient not taking: Reported on 01/24/2024) 84 tablet 0   meloxicam  (MOBIC ) 15 MG tablet One tab PO qAM with breakfast for 2 weeks, then daily prn pain. 30 tablet 3   No current facility-administered medications for this visit.   Facility-Administered Medications Ordered in Other Visits  Medication Dose Route Frequency Provider Last Rate Last Admin   acetaminophen  (TYLENOL ) tablet 650 mg  650 mg Oral Once Melanee Annah BROCKS, MD       diphenhydrAMINE  (BENADRYL )  capsule 25 mg  25 mg Oral Once Rao, Archana C, MD        Allergies as of 04/04/2024   (No Known Allergies)    Family History  Problem Relation Age of Onset   Osteoporosis Mother    Diabetes Father        non- insulin Dependent   Hypertension Father    Other Sister        produces extra CSF that causes visual abnormalities   Hypertension Maternal Grandfather    Heart disease Maternal Grandfather    Breast cancer Paternal Grandmother 28   Cancer Paternal Grandmother    Stroke Paternal Grandfather    Hypertension Paternal Grandfather    Colon cancer Neg Hx    Ovarian cancer Neg Hx    Cervical cancer Neg Hx     Social History   Socioeconomic History   Marital status: Married    Spouse name: [name]   Number of children: 3   Years of education: 16   Highest education level: Not on file  Occupational History    Comment: [employer]   Occupation: student    Comment: [employer] 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  Tobacco Use   Smoking status: Never   Smokeless tobacco: Never  Vaping Use   Vaping status: Never Used  Substance and Sexual Activity   Alcohol use: Yes    Comment: Once per month; wine or mixed drinks   Drug use: No   Sexual activity: Yes  Other Topics Concern   Not on file  Social History Narrative   Not on file   Social Drivers of Health   Financial Resource Strain: Not on file  Food Insecurity: Not on file  Transportation Needs: Not on file  Physical Activity: Not on file  Stress: Not on file  Social Connections: Not on file  Intimate Partner Violence: Not on file     Review of Systems   Gen: Denies any fever, chills, fatigue, weight loss, lack of appetite.  CV: Denies chest pain, heart palpitations, peripheral edema, syncope.  Resp: Denies shortness of breath at rest or with exertion. Denies wheezing or cough.  GI: see HPI GU : Denies urinary burning, urinary frequency, urinary hesitancy MS: Denies joint pain, muscle weakness,  cramps, or limitation of movement.  Derm: Denies rash, itching, dry skin Psych: Denies depression, anxiety, memory loss, and confusion Heme: Denies bruising, bleeding, and enlarged lymph nodes.   Physical Exam   BP 123/79   Pulse 71   Temp 99.1 F (37.3 C)   Ht 5' 3 (1.6 m)   Wt 161 lb (73 kg)   LMP 03/17/2024   BMI 28.52 kg/m  General:   Alert and oriented. Pleasant and cooperative. Well-nourished  and well-developed.  Head:  Normocephalic and atraumatic. Eyes:  Without icterus Ears:  Normal auditory acuity. Lungs:  Clear to auscultation bilaterally.  Heart:  S1, S2 present without murmurs appreciated.  Abdomen:  +BS, soft, non-tender and non-distended. No HSM noted. No guarding or rebound. No masses appreciated.  Rectal:  Deferred  Msk:  Symmetrical without gross deformities. Normal posture. Extremities:  Without edema. Neurologic:  Alert and  oriented x4;  grossly normal neurologically. Skin:  Intact without significant lesions or rashes. Psych:  Alert and cooperative. Normal mood and affect.   Assessment   Cailin Gebel Hollingworth is a delightful 48 y.o. 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  GERD now with solid food dysphagia: suspect in light of chronic GERD without PPI could have esophagitis, ring, stricture, etc. Suspect LPR as well in light of chronic coughing postprandially. Needs daily PPI; omeprazole sporadically taken in the past. Will also arrange EGD/dilation in the near future.  Hx of positive H.pylori stool antigen s/p treatment in past: needs documented eradication. Gastric biopsies at time of EGD  Average risk initial screening colonoscopy to be planned as well.   Long-standing IDA: not a new diagnosis. Recent ferritin 130. Negative celiac serologies in past. Suspect GYN source due to  heavy menses. Not a candidate for endometrial ablation per patient.      PLAN   Start pantoprazole  once daily  Proceed with colonoscopy, EGD with gastric biopsies (hx of H.pylori) and esophageal dilation due to dysphagia. risks, benefits, and alternatives have been discussed with the patient in detail. The patient states understanding and desires to proceed.  3 month follow-up.    Therisa MICAEL Stager, PhD, ANP-BC [facility]

## 2024-04-04 NOTE — Patient Instructions (Signed)
 We are arranging a routine screening colonoscopy and an upper endoscopy with dilation due to chronic GERD and dysphagia.   I have sent in pantoprazole  to take once a day, at least 30 minutes before eating for best efficacy.  We will see you back in about 3 months to see how you are doing!  It was a pleasure to see you today. I want to create trusting relationships with patients and provide genuine, compassionate, and quality care. I truly value your feedback, so please be on the lookout for a survey regarding your visit with me today. I appreciate your time in completing this!         Therisa MICAEL Stager, PhD, ANP-BC [facility]

## 2024-04-10 ENCOUNTER — Telehealth: Admitting: Physician Assistant

## 2024-04-10 ENCOUNTER — Encounter: Payer: Self-pay | Admitting: Oncology

## 2024-04-10 ENCOUNTER — Other Ambulatory Visit: Payer: Self-pay

## 2024-04-10 DIAGNOSIS — H00014 Hordeolum externum left upper eyelid: Secondary | ICD-10-CM

## 2024-04-10 MED ORDER — ERYTHROMYCIN 5 MG/GM OP OINT
1.0000 | TOPICAL_OINTMENT | Freq: Every day | OPHTHALMIC | 0 refills | Status: AC
Start: 2024-04-10 — End: 2024-04-18
  Filled 2024-04-10: qty 3.5, 5d supply, fill #0

## 2024-04-10 NOTE — Progress Notes (Signed)
 We are sorry that you are not feeling well. Here is how we plan to help!  Based on what you have shared with me it looks like you have a stye.  A stye is an inflammation of the eyelid.  It is often a red, painful lump near the edge of the eyelid that may look like a boil or a pimple.  A stye develops when an infection occurs at the base of an eyelash.   We have made appropriate suggestions for you based upon your presentation: Simple styes can be treated without medical intervention.  Most styes either resolve spontaneously or resolve with simple home treatment by applying warm compresses or heated washcloth to the stye for about 10-15 minutes three to four times a day. This causes the stye to drain and resolve. and I have prescribed Erythromycin Ophthalmic ointment 0.5% Apply topically in affected eye daily at bedtime for 5 days. To apply: Tilt the head back and, pressing your finger gently on the skin just beneath the lower eyelid, pull the lower eyelid away from the eye to make a space. Squeeze a thin strip of ointment into this space. A 1-cm (approximately 1/3-inch) strip of ointment is usually enough, unless you have been told by your doctor to use a different amount. Let go of the eyelid and gently close the eyes. Keep the eyes closed for 1 or 2 minutes to allow the medicine to come into contact with the infection.      HOME CARE:  Wash your hands often! Let the stye open on its own. Don't squeeze or open it. Don't rub your eyes. This can irritate your eyes and let in bacteria.  If you need to touch your eyes, wash your hands first. Don't wear eye makeup or contact lenses until the area has healed.  GET HELP RIGHT AWAY IF:  Your symptoms do not improve. You develop blurred or loss of vision. Your symptoms worsen (increased discharge, pain or redness).  Thank you for choosing an e-visit.  Your e-visit answers were reviewed by a board certified advanced clinical practitioner to complete  your personal care plan.  Depending upon the condition, your plan could have included both over the counter or prescription medications.  Please review your pharmacy choice.  Make sure the pharmacy is open so you can pick up prescription now.  If there is a problem, you may contact your provider through [employer] and have the prescription routed to another pharmacy.    Your safety is important to us .  If you have drug allergies check your prescription carefully.  For the next 24 hours you can use MyChart to ask questions about today's visit, request a non-urgent call back, or ask for a work or school excuse.  You will get an email in the next two days asking about your experience.  I hope you that your e-visit has been valuable and will speed your recovery.  I have spent 5 minutes in review of e-visit questionnaire, review and updating patient chart, medical decision making and response to patient.   Delon CHRISTELLA Dickinson, PA-C

## 2024-04-13 ENCOUNTER — Encounter: Payer: Self-pay | Admitting: Orthopedic Surgery

## 2024-04-13 NOTE — Progress Notes (Unsigned)
 Referring Physician:  Myrla Jon HERO, MD [address] [place],  [place] [address]  Primary Physician:  Myrla Jon HERO, MD  History of Present Illness: 04/18/2024 Diane Curry has a history of migraines, GERD.   History of scoliosis surgery in 1986.   She has only occasional twinges of pain in her lower back since her surgery. She has no current back or leg pain. No numbness, tingling, or weakness.   She is here to establish care with us .   Tobacco use: Does not smoke.   Bowel/Bladder Dysfunction: none  Conservative measures:  Physical therapy: Has not participated for her back/scoliosis recently but has done OT at [facility] for elbow pain and weakness Multimodal medical therapy including regular antiinflammatories: acetaminophen , meloxicam ,Skelaxin  Injections: no epidural steroid injections  Past Surgery:  1986- spine surgery-Harrington rods for scolosis   Diane Curry has no symptoms of cervical myelopathy.  The symptoms are causing a significant impact on the patient's life.   Review of Systems:  A 10 point review of systems is negative, except for the pertinent positives and negatives detailed in the HPI.  Past Medical History: Past Medical History:  Diagnosis Date   Anemia    Menorrhagia    Migraines    without aura   Vitamin D  deficiency     Past Surgical History: Past Surgical History:  Procedure Laterality Date   DILATION AND CURETTAGE OF UTERUS  2013   SAB at 10-12 weeks, warranted PRBC and admission overnight. [place], [place]   harrington rod  1987   PILONIDAL CYST EXCISION  2000   Dr.Byrnette   SPINE SURGERY  1986   Harrington rods for scolosis   TONSILLECTOMY  1992    Allergies: Allergies as of 04/18/2024   (No Known Allergies)    Medications: Outpatient Encounter Medications as of 04/18/2024  Medication Sig   acetaminophen  (TYLENOL ) 325 MG tablet Take 650 mg by mouth every 6 (six) hours as needed.    doxycycline (VIBRA-TABS) 100 MG tablet Take 1 tablet (100 mg total) by mouth every 12 (twelve) hours.   erythromycin ophthalmic ointment Place 1 Application into the left eye at bedtime for 5 days.   estradiol (VIVELLE-DOT) 0.025 MG/24HR Place 1 patch onto the skin 2 (two) times a week.   ibuprofen  (ADVIL ,MOTRIN ) 200 MG tablet Take 200 mg by mouth every 6 (six) hours as needed.   loratadine (CLARITIN) 10 MG tablet Take 10 mg by mouth daily.   metaxalone  (SKELAXIN ) 800 MG tablet Take 1 tablet (800 mg total) by mouth 3 (three) times daily as needed for muscle spasms.   Multiple Vitamins-Minerals (WOMENS MULTIVITAMIN PO) Take by mouth.   pantoprazole  (PROTONIX ) 40 MG tablet Take 1 tablet (40 mg total) by mouth daily. 30 minutes before breakfast   progesterone (PROMETRIUM) 100 MG capsule Take 100 mg by mouth at bedtime.   Sod Picosulfate-Mag Ox-Cit Acd (CLENPIQ) 10-3.5-12 MG-GM -GM/175ML SOLN Take 1 kit by mouth as directed.   SUMAtriptan  (IMITREX ) 100 MG tablet Take 1 tablet (100 mg total) by mouth once as needed for up to 1 dose. May repeat in 2 hours if headache persists or recurs.   Facility-Administered Encounter Medications as of 04/18/2024  Medication   acetaminophen  (TYLENOL ) tablet 650 mg   diphenhydrAMINE  (BENADRYL ) capsule 25 mg    Social History: Social History   Tobacco Use   Smoking status: Never   Smokeless tobacco: Never  Vaping Use   Vaping status: Never Used  Substance Use Topics  Alcohol use: Yes    Comment: Once per month; wine or mixed drinks   Drug use: No    Family Medical History: Family History  Problem Relation Age of Onset   Osteoporosis Mother    Diabetes Father        non- insulin Dependent   Hypertension Father    Other Sister        produces extra CSF that causes visual abnormalities   Hypertension Maternal Grandfather    Heart disease Maternal Grandfather    Breast cancer Paternal Grandmother 47   Cancer Paternal Grandmother    Stroke  Paternal Grandfather    Hypertension Paternal Grandfather    Colon cancer Neg Hx    Ovarian cancer Neg Hx    Cervical cancer Neg Hx     Physical Examination: Vitals:   04/18/24 0909  BP: 130/82    General: Patient is well developed, well nourished, calm, collected, and in no apparent distress. Attention to examination is appropriate.  Respiratory: Patient is breathing without any difficulty.   NEUROLOGICAL:     Awake, alert, oriented to person, place, and time.  Speech is clear and fluent. Fund of knowledge is appropriate.   Cranial Nerves: Pupils equal round and reactive to light.  Facial tone is symmetric.    Right shoulder is elevated and she has right thoracic rib hump.   No tenderness over thoracic or lumbar spine.   No abnormal lesions on exposed skin.   Strength: Side Biceps Triceps Deltoid Interossei Grip Wrist Ext. Wrist Flex.  R 5 5 5 5 5 5 5   L 5 5 5 5 5 5 5    Side Iliopsoas Quads Hamstring PF DF EHL  R 5 5 5 5 5 5   L 5 5 5 5 5 5    Reflexes are 2+ and symmetric at the biceps, brachioradialis, patella and achilles.   Hoffman's is absent on right, present on left.  Clonus is not present.   Bilateral upper and lower extremity sensation is intact to light touch.     No pain with IR/ER of both hips.   Gait is normal.     Medical Decision Making  Imaging: No recent spinal imaging.   Assessment and Plan: Diane Curry has a history of scoliosis surgery in 1986.   She has only occasional twinges of pain in her lower back since her surgery. She has no current back or leg pain. No numbness, tingling, or weakness.   She is here to establish care.   Treatment options discussed with patient and following plan made:   - Full length scoliosis xrays ordered. She will get [facility] at any [facility] facility.  - Will message her with results.  - May consider PT for good HEP. Will discuss after getting xrays.   I spent a total of 30 minutes in face-to-face and  non-face-to-face activities related to this patient's care today including review of outside records, review of imaging, review of symptoms, physical exam, discussion of differential diagnosis, discussion of treatment options, and documentation.   Thank you for involving me in the care of this patient.   Glade Boys PA-C Dept. of Neurosurgery

## 2024-04-17 ENCOUNTER — Other Ambulatory Visit: Payer: Self-pay

## 2024-04-17 ENCOUNTER — Encounter: Payer: Self-pay | Admitting: Oncology

## 2024-04-17 MED ORDER — DOXYCYCLINE HYCLATE 100 MG PO TABS
100.0000 mg | ORAL_TABLET | Freq: Two times a day (BID) | ORAL | 0 refills | Status: DC
  Filled 2024-04-17: qty 20, 10d supply, fill #0

## 2024-04-18 ENCOUNTER — Encounter: Payer: Self-pay | Admitting: Orthopedic Surgery

## 2024-04-18 ENCOUNTER — Ambulatory Visit: Admitting: Orthopedic Surgery

## 2024-04-18 VITALS — BP 130/82 | Ht 63.0 in | Wt 160.6 lb

## 2024-04-18 DIAGNOSIS — Z981 Arthrodesis status: Secondary | ICD-10-CM | POA: Diagnosis not present

## 2024-04-18 DIAGNOSIS — M4120 Other idiopathic scoliosis, site unspecified: Secondary | ICD-10-CM

## 2024-04-19 DIAGNOSIS — L820 Inflamed seborrheic keratosis: Secondary | ICD-10-CM | POA: Diagnosis not present

## 2024-04-19 DIAGNOSIS — D2261 Melanocytic nevi of right upper limb, including shoulder: Secondary | ICD-10-CM | POA: Diagnosis not present

## 2024-04-19 DIAGNOSIS — R208 Other disturbances of skin sensation: Secondary | ICD-10-CM | POA: Diagnosis not present

## 2024-04-19 DIAGNOSIS — D2272 Melanocytic nevi of left lower limb, including hip: Secondary | ICD-10-CM | POA: Diagnosis not present

## 2024-04-19 DIAGNOSIS — D235 Other benign neoplasm of skin of trunk: Secondary | ICD-10-CM | POA: Diagnosis not present

## 2024-04-19 DIAGNOSIS — D2262 Melanocytic nevi of left upper limb, including shoulder: Secondary | ICD-10-CM | POA: Diagnosis not present

## 2024-04-19 DIAGNOSIS — D485 Neoplasm of uncertain behavior of skin: Secondary | ICD-10-CM | POA: Diagnosis not present

## 2024-04-19 DIAGNOSIS — D225 Melanocytic nevi of trunk: Secondary | ICD-10-CM | POA: Diagnosis not present

## 2024-04-21 ENCOUNTER — Inpatient Hospital Stay

## 2024-05-01 ENCOUNTER — Ambulatory Visit
Admission: RE | Admit: 2024-05-01 | Discharge: 2024-05-01 | Disposition: A | Source: Ambulatory Visit | Attending: Orthopedic Surgery | Admitting: Orthopedic Surgery

## 2024-05-01 ENCOUNTER — Ambulatory Visit
Admission: RE | Admit: 2024-05-01 | Discharge: 2024-05-01 | Disposition: A | Attending: Orthopedic Surgery | Admitting: Orthopedic Surgery

## 2024-05-01 DIAGNOSIS — Z981 Arthrodesis status: Secondary | ICD-10-CM

## 2024-05-01 DIAGNOSIS — M4120 Other idiopathic scoliosis, site unspecified: Secondary | ICD-10-CM | POA: Insufficient documentation

## 2024-05-01 DIAGNOSIS — M4184 Other forms of scoliosis, thoracic region: Secondary | ICD-10-CM | POA: Diagnosis not present

## 2024-05-01 DIAGNOSIS — M47814 Spondylosis without myelopathy or radiculopathy, thoracic region: Secondary | ICD-10-CM | POA: Diagnosis not present

## 2024-05-02 ENCOUNTER — Ambulatory Visit: Payer: Self-pay | Admitting: Family Medicine

## 2024-05-02 ENCOUNTER — Other Ambulatory Visit: Payer: Self-pay

## 2024-05-02 DIAGNOSIS — H00014 Hordeolum externum left upper eyelid: Secondary | ICD-10-CM

## 2024-05-02 DIAGNOSIS — D500 Iron deficiency anemia secondary to blood loss (chronic): Secondary | ICD-10-CM

## 2024-05-02 DIAGNOSIS — Z0001 Encounter for general adult medical examination with abnormal findings: Secondary | ICD-10-CM | POA: Diagnosis not present

## 2024-05-02 DIAGNOSIS — E559 Vitamin D deficiency, unspecified: Secondary | ICD-10-CM | POA: Diagnosis not present

## 2024-05-02 DIAGNOSIS — G43009 Migraine without aura, not intractable, without status migrainosus: Secondary | ICD-10-CM

## 2024-05-02 DIAGNOSIS — Z23 Encounter for immunization: Secondary | ICD-10-CM

## 2024-05-02 DIAGNOSIS — Z8262 Family history of osteoporosis: Secondary | ICD-10-CM

## 2024-05-02 DIAGNOSIS — E782 Mixed hyperlipidemia: Secondary | ICD-10-CM | POA: Diagnosis not present

## 2024-05-02 DIAGNOSIS — Z0000 Encounter for general adult medical examination without abnormal findings: Secondary | ICD-10-CM

## 2024-05-02 MED ORDER — DOXYCYCLINE HYCLATE 100 MG PO TABS
100.0000 mg | ORAL_TABLET | Freq: Two times a day (BID) | ORAL | 0 refills | Status: AC
  Filled 2024-05-02: qty 14, 7d supply, fill #0

## 2024-05-02 MED ORDER — SUMATRIPTAN SUCCINATE 100 MG PO TABS: 100.0000 mg | ORAL_TABLET | Freq: Once | ORAL | 11 refills | Status: AC | PRN

## 2024-05-02 MED ORDER — METAXALONE 800 MG PO TABS: 800.0000 mg | ORAL_TABLET | Freq: Three times a day (TID) | ORAL | 1 refills | Status: AC | PRN

## 2024-05-02 NOTE — Progress Notes (Signed)
 Complete physical exam   Patient: Diane Curry   DOB: 1976/03/04   48 y.o. Female  MRN: [id number] Visit Date: 05/02/2024  Today's healthcare provider: Jon Eva, MD   Chief Complaint  Patient presents with   Annual Exam    Last completed 04/20/23 Diet -  limits sugar intake Exercise - 30 minutes 5 days a week Feeling - well Sleeping - fairly well Concerns -  none   Subjective    Diane Curry is a 48 y.o. female who presents today for a complete physical exam.   Discussed the use of AI scribe software for clinical note transcription with the patient, who gave verbal consent to proceed.  History of Present Illness   Diane Curry is a 48 year old female who presents for a follow-up on her eye condition and routine health maintenance.  She reports a persistent red eye since about April 10, 2024. Warm compresses and erythromycin  did not help. Doxycycline  improved swelling and redness but the eye remains noticeably red.  She is scheduled for a colonoscopy on Monday. She had a mammogram in October and a full skin check two weeks before Thanksgiving after an 48-month interval.  She received a flu vaccine. She recalls completing three hepatitis B vaccine series in the past without titer checks.  She uses hormone replacement therapy with an estrogen patch and progesterone for menopausal symptoms. Her menstrual cycles are regular with only one irregular episode.  Her mother and grandmother have osteoporosis. She asks if she should have a DEXA scan.  She is concerned about iron  levels and requests labs for iron , TIBC, and ferritin, plus ApoB, Lp(a), cholesterol panel, A1c, vitamin D , and high-sensitivity CRP. She notes a history of low vitamin B12.       Last depression screening scores    05/02/2024    8:38 AM 01/24/2024    1:05 PM 06/07/2023    2:46 PM  PHQ 2/9 Scores  PHQ - 2 Score 0 0 0   Last fall risk screening    05/02/2024    8:37 AM  Fall  Risk   Falls in the past year? 0  Number falls in past yr: 0  Injury with Fall? 0  Risk for fall due to : No Fall Risks  Follow up Falls evaluation completed        Medications: Outpatient Medications Prior to Visit  Medication Sig   acetaminophen  (TYLENOL ) 325 MG tablet Take 650 mg by mouth every 6 (six) hours as needed.   estradiol (VIVELLE-DOT) 0.025 MG/24HR Place 1 patch onto the skin 2 (two) times a week.   ibuprofen  (ADVIL ,MOTRIN ) 200 MG tablet Take 200 mg by mouth every 6 (six) hours as needed.   loratadine (CLARITIN) 10 MG tablet Take 10 mg by mouth daily.   Multiple Vitamins-Minerals (WOMENS MULTIVITAMIN PO) Take by mouth.   pantoprazole  (PROTONIX ) 40 MG tablet Take 1 tablet (40 mg total) by mouth daily. 30 minutes before breakfast   progesterone (PROMETRIUM) 100 MG capsule Take 100 mg by mouth at bedtime.   Sod Picosulfate-Mag Ox-Cit Acd (CLENPIQ ) 10-3.5-12 MG-GM -GM/175ML SOLN Take 1 kit by mouth as directed.   [DISCONTINUED] doxycycline  (VIBRA -TABS) 100 MG tablet Take 1 tablet (100 mg total) by mouth every 12 (twelve) hours.   [DISCONTINUED] metaxalone  (SKELAXIN ) 800 MG tablet Take 1 tablet (800 mg total) by mouth 3 (three) times daily as needed for muscle spasms.   [DISCONTINUED] SUMAtriptan  (IMITREX ) 100 MG tablet Take 1  tablet (100 mg total) by mouth once as needed for up to 1 dose. May repeat in 2 hours if headache persists or recurs.   Facility-Administered Medications Prior to Visit  Medication Dose Route Frequency Provider   acetaminophen  (TYLENOL ) tablet 650 mg  650 mg Oral Once Rao, Archana C, MD   diphenhydrAMINE  (BENADRYL ) capsule 25 mg  25 mg Oral Once Rao, Archana C, MD    Review of Systems    Objective    There were no vitals taken for this visit.   Physical Exam Vitals reviewed.  Constitutional:      General: She is not in acute distress.    Appearance: Normal appearance. She is well-developed. She is not diaphoretic.  HENT:     Head:  Normocephalic and atraumatic.     Right Ear: Tympanic membrane, ear canal and external ear normal.     Left Ear: Tympanic membrane, ear canal and external ear normal.     Nose: Nose normal.     Mouth/Throat:     Mouth: Mucous membranes are moist.     Pharynx: Oropharynx is clear. No oropharyngeal exudate.  Eyes:     General: No scleral icterus.    Conjunctiva/sclera: Conjunctivae normal.     Pupils: Pupils are equal, round, and reactive to light.  Neck:     Thyroid : No thyromegaly.  Cardiovascular:     Rate and Rhythm: Normal rate and regular rhythm.     Heart sounds: Normal heart sounds. No murmur heard. Pulmonary:     Effort: Pulmonary effort is normal. No respiratory distress.     Breath sounds: Normal breath sounds. No wheezing or rales.  Abdominal:     General: There is no distension.     Palpations: Abdomen is soft.     Tenderness: There is no abdominal tenderness.  Musculoskeletal:        General: No deformity.     Cervical back: Neck supple.     Right lower leg: No edema.     Left lower leg: No edema.  Lymphadenopathy:     Cervical: No cervical adenopathy.  Skin:    General: Skin is warm and dry.     Findings: No rash.  Neurological:     Mental Status: She is alert and oriented to person, place, and time. Mental status is at baseline.     Gait: Gait normal.  Psychiatric:        Mood and Affect: Mood normal.        Behavior: Behavior normal.        Thought Content: Thought content normal.      No results found for any visits on 05/02/24.  Assessment & Plan    Routine Health Maintenance and Physical Exam  Exercise Activities and Dietary recommendations  Goals   None     Immunization History  Administered Date(s) Administered   Influenza,inj,Quad PF,6+ Mos 02/08/2019   Influenza-Unspecified 02/16/2018, 03/28/2021, 03/06/2022, 03/30/2023, 03/06/2024   PFIZER(Purple Top)SARS-COV-2 Vaccination 06/16/2019, 07/07/2019   PNEUMOCOCCAL CONJUGATE-20 05/02/2024    Pfizer Covid-19 Vaccine Bivalent Booster 22yrs & up 03/22/2020   Td 12/17/2017   Tdap 06/17/2006   Zoster Recombinant(Shingrix ) 05/02/2024    Health Maintenance  Topic Date Due   Hepatitis B Vaccines 19-59 Average Risk (1 of 3 - 19+ 3-dose series) Never done   Colonoscopy  Never done   COVID-19 Vaccine (4 - 2025-26 season) 01/31/2024   Zoster Vaccines- Shingrix  (2 of 2) 06/27/2024   Mammogram  03/17/2026  Cervical Cancer Screening (HPV/Pap Cotest)  04/10/2026   DTaP/Tdap/Td (3 - Td or Tdap) 12/18/2027   Pneumococcal Vaccine: 50+ Years  Completed   Influenza Vaccine  Completed   Hepatitis C Screening  Completed   HIV Screening  Completed   HPV VACCINES  Aged Out   Meningococcal B Vaccine  Aged Out   Fecal DNA (Cologuard)  Discontinued    Discussed health benefits of physical activity, and encouraged her to engage in regular exercise appropriate for her age and condition.  Problem List Items Addressed This Visit       Cardiovascular and Mediastinum   Migraine   Relevant Medications   metaxalone  (SKELAXIN ) 800 MG tablet   SUMAtriptan  (IMITREX ) 100 MG tablet     Other   Avitaminosis D   Relevant Orders   VITAMIN D  25 Hydroxy (Vit-D Deficiency, Fractures)   Iron  deficiency anemia   Relevant Orders   Iron , TIBC and Ferritin Panel   CBC w/Diff/Platelet   Other Visit Diagnoses       Encounter for annual physical exam    -  Primary   Relevant Orders   Iron , TIBC and Ferritin Panel   CBC w/Diff/Platelet   Lipid panel   Comprehensive metabolic panel with GFR   Hemoglobin A1c   VITAMIN D  25 Hydroxy (Vit-D Deficiency, Fractures)   CRP High sensitivity   Apolipoprotein B   Lipoprotein A (LPA)   B12   CT CORONARY MORPH W/CTA COR W/SCORE W/CA W/CM &/OR WO/CM   DG Bone Density     Family history of osteoporosis       Relevant Orders   DG Bone Density     Moderate mixed hyperlipidemia not requiring statin therapy       Relevant Orders   Lipid panel   Comprehensive  metabolic panel with GFR   Hemoglobin A1c   CRP High sensitivity   Apolipoprotein B   Lipoprotein A (LPA)   B12   CT CORONARY MORPH W/CTA COR W/SCORE W/CA W/CM &/OR WO/CM     Immunization due       Relevant Orders   Pneumococcal conjugate vaccine 20-valent (Completed)   Varicella-zoster vaccine IM (Completed)     Hordeolum externum of left upper eyelid               Woman's Wellness Visit Routine wellness visit with emphasis on cancer screenings and vaccinations. Colonoscopy scheduled for Monday. Mammogram completed in October. Skin check done two weeks before Thanksgiving. Discussed pneumonia and shingles vaccines, with shingles vaccine requiring a second dose in 2-6 months. Discussed potential side effects of the second shingles vaccine dose. Discussed hepatitis B vaccination history and potential need for titer. Discussed coronary calcium CT and DEXA scan for risk stratification and osteoporosis screening due to family history. - Administered pneumonia vaccine - Scheduled shingles vaccine with a nurse visit in 2-6 months - Ordered coronary calcium CT - Ordered DEXA scan at [facility] - Ordered labs including ApoB, LPA, cholesterol panel, A1c, vitamin D , CMP, high sensitivity CRP, and vitamin B12  Hordeolum Persistent hordeolum since November 10th, initially treated with erythromycin  and doxycycline  with partial improvement. Considering further intervention if no improvement with additional doxycycline . - Prescribed another round of doxycycline  - Will consider referral to ophthalmologist if no improvement  Iron  deficiency anemia due to chronic blood loss Iron  deficiency anemia managed with regular monitoring of iron  levels. - Ordered iron  panel including iron , TIBC, and ferritin  Vitamin D  deficiency Vitamin D  deficiency managed with regular  monitoring. - Ordered vitamin D  level  Mixed hyperlipidemia - Ordered cholesterol panel       Return in about 6 months (around  10/31/2024) for shingrix #2 and CPE in 1 yr.     Jon Eva, MD  [facility] [phone] (phone) [phone] (fax)  [facility]

## 2024-05-03 LAB — CBC WITH DIFFERENTIAL/PLATELET
Basophils Absolute: 0.1 x10E3/uL (ref 0.0–0.2)
Basos: 1 %
EOS (ABSOLUTE): 0.1 x10E3/uL (ref 0.0–0.4)
Eos: 2 %
Hematocrit: 39.3 % (ref 34.0–46.6)
Hemoglobin: 12.4 g/dL (ref 11.1–15.9)
Immature Grans (Abs): 0 x10E3/uL (ref 0.0–0.1)
Immature Granulocytes: 0 %
Lymphocytes Absolute: 2.2 x10E3/uL (ref 0.7–3.1)
Lymphs: 32 %
MCH: 28 pg (ref 26.6–33.0)
MCHC: 31.6 g/dL (ref 31.5–35.7)
MCV: 89 fL (ref 79–97)
Monocytes Absolute: 0.4 x10E3/uL (ref 0.1–0.9)
Monocytes: 6 %
Neutrophils Absolute: 4.2 x10E3/uL (ref 1.4–7.0)
Neutrophils: 59 %
Platelets: 340 x10E3/uL (ref 150–450)
RBC: 4.43 x10E6/uL (ref 3.77–5.28)
RDW: 12.9 % (ref 11.7–15.4)
WBC: 7 x10E3/uL (ref 3.4–10.8)

## 2024-05-03 LAB — COMPREHENSIVE METABOLIC PANEL WITH GFR
ALT: 18 IU/L (ref 0–32)
AST: 17 IU/L (ref 0–40)
Albumin: 4.5 g/dL (ref 3.9–4.9)
Alkaline Phosphatase: 52 IU/L (ref 41–116)
BUN/Creatinine Ratio: 19 (ref 9–23)
BUN: 12 mg/dL (ref 6–24)
Bilirubin Total: 0.2 mg/dL (ref 0.0–1.2)
CO2: 21 mmol/L (ref 20–29)
Calcium: 9.5 mg/dL (ref 8.7–10.2)
Chloride: 104 mmol/L (ref 96–106)
Creatinine, Ser: 0.63 mg/dL (ref 0.57–1.00)
Globulin, Total: 2.2 g/dL (ref 1.5–4.5)
Glucose: 94 mg/dL (ref 70–99)
Potassium: 4.4 mmol/L (ref 3.5–5.2)
Sodium: 138 mmol/L (ref 134–144)
Total Protein: 6.7 g/dL (ref 6.0–8.5)
eGFR: 108 mL/min/1.73 (ref >59)

## 2024-05-03 LAB — APOLIPOPROTEIN B: Apolipoprotein B: 101 mg/dL — ABNORMAL HIGH (ref <90)

## 2024-05-03 LAB — HEMOGLOBIN A1C
Est. average glucose Bld gHb Est-mCnc: 105 mg/dL
Hgb A1c MFr Bld: 5.3 % (ref 4.8–5.6)

## 2024-05-03 LAB — LIPID PANEL
Chol/HDL Ratio: 3.4 ratio (ref 0.0–4.4)
Cholesterol, Total: 222 mg/dL — ABNORMAL HIGH (ref 100–199)
HDL: 65 mg/dL (ref >39)
LDL Chol Calc (NIH): 145 mg/dL — ABNORMAL HIGH (ref 0–99)
Triglycerides: 67 mg/dL (ref 0–149)
VLDL Cholesterol Cal: 12 mg/dL (ref 5–40)

## 2024-05-03 LAB — HIGH SENSITIVITY CRP: CRP, High Sensitivity: 0.69 mg/L (ref 0.00–3.00)

## 2024-05-03 LAB — VITAMIN B12: Vitamin B-12: 476 pg/mL (ref 232–1245)

## 2024-05-03 LAB — IRON,TIBC AND FERRITIN PANEL
Ferritin: 68 ng/mL (ref 15–150)
Iron Saturation: 21 % (ref 15–55)
Iron: 56 ug/dL (ref 27–159)
Total Iron Binding Capacity: 267 ug/dL (ref 250–450)
UIBC: 211 ug/dL (ref 131–425)

## 2024-05-03 LAB — VITAMIN D 25 HYDROXY (VIT D DEFICIENCY, FRACTURES): Vit D, 25-Hydroxy: 30.3 ng/mL (ref 30.0–100.0)

## 2024-05-03 LAB — LIPOPROTEIN A (LPA): Lipoprotein (a): 62.9 nmol/L (ref <75.0)

## 2024-05-05 ENCOUNTER — Other Ambulatory Visit
Admission: RE | Admit: 2024-05-05 | Discharge: 2024-05-05 | Disposition: A | Source: Ambulatory Visit | Attending: Internal Medicine | Admitting: Internal Medicine

## 2024-05-05 DIAGNOSIS — Z1211 Encounter for screening for malignant neoplasm of colon: Secondary | ICD-10-CM

## 2024-05-05 DIAGNOSIS — R1310 Dysphagia, unspecified: Secondary | ICD-10-CM | POA: Diagnosis not present

## 2024-05-05 LAB — PREGNANCY, URINE: Preg Test, Ur: NEGATIVE

## 2024-05-07 ENCOUNTER — Ambulatory Visit: Payer: Self-pay | Admitting: Family Medicine

## 2024-05-08 ENCOUNTER — Ambulatory Visit (HOSPITAL_COMMUNITY)
Admission: RE | Admit: 2024-05-08 | Discharge: 2024-05-08 | Disposition: A | Attending: Internal Medicine | Admitting: Internal Medicine

## 2024-05-08 ENCOUNTER — Ambulatory Visit (HOSPITAL_COMMUNITY): Admitting: Anesthesiology

## 2024-05-08 ENCOUNTER — Encounter (HOSPITAL_COMMUNITY): Admission: RE | Disposition: A | Payer: Self-pay | Source: Home / Self Care | Attending: Internal Medicine

## 2024-05-08 ENCOUNTER — Other Ambulatory Visit: Payer: Self-pay

## 2024-05-08 ENCOUNTER — Encounter (HOSPITAL_COMMUNITY): Payer: Self-pay | Admitting: Internal Medicine

## 2024-05-08 DIAGNOSIS — Z1211 Encounter for screening for malignant neoplasm of colon: Secondary | ICD-10-CM | POA: Diagnosis not present

## 2024-05-08 DIAGNOSIS — Z139 Encounter for screening, unspecified: Secondary | ICD-10-CM | POA: Diagnosis not present

## 2024-05-08 DIAGNOSIS — K219 Gastro-esophageal reflux disease without esophagitis: Secondary | ICD-10-CM

## 2024-05-08 DIAGNOSIS — K222 Esophageal obstruction: Secondary | ICD-10-CM | POA: Diagnosis not present

## 2024-05-08 DIAGNOSIS — K2950 Unspecified chronic gastritis without bleeding: Secondary | ICD-10-CM

## 2024-05-08 DIAGNOSIS — K2289 Other specified disease of esophagus: Secondary | ICD-10-CM | POA: Diagnosis not present

## 2024-05-08 DIAGNOSIS — K635 Polyp of colon: Secondary | ICD-10-CM | POA: Diagnosis not present

## 2024-05-08 DIAGNOSIS — K449 Diaphragmatic hernia without obstruction or gangrene: Secondary | ICD-10-CM | POA: Diagnosis not present

## 2024-05-08 DIAGNOSIS — D120 Benign neoplasm of cecum: Secondary | ICD-10-CM | POA: Diagnosis not present

## 2024-05-08 DIAGNOSIS — I10 Essential (primary) hypertension: Secondary | ICD-10-CM | POA: Diagnosis not present

## 2024-05-08 DIAGNOSIS — R1310 Dysphagia, unspecified: Secondary | ICD-10-CM

## 2024-05-08 HISTORY — PX: ESOPHAGEAL DILATION: SHX303

## 2024-05-08 HISTORY — PX: COLONOSCOPY: SHX5424

## 2024-05-08 HISTORY — PX: ESOPHAGOGASTRODUODENOSCOPY: SHX5428

## 2024-05-08 HISTORY — PX: POLYPECTOMY: SHX149

## 2024-05-08 SURGERY — COLONOSCOPY
Anesthesia: Monitor Anesthesia Care

## 2024-05-08 MED ORDER — PROPOFOL 500 MG/50ML IV EMUL
INTRAVENOUS | Status: DC | PRN
  Administered 2024-05-08: 200 ug/kg/min via INTRAVENOUS

## 2024-05-08 MED ORDER — LACTATED RINGERS IV SOLN: INTRAVENOUS | Status: DC | PRN

## 2024-05-08 MED ORDER — LACTATED RINGERS IV SOLN: INTRAVENOUS | Status: DC

## 2024-05-08 MED ORDER — PROPOFOL 10 MG/ML IV BOLUS
INTRAVENOUS | Status: DC | PRN
  Administered 2024-05-08: 100 mg via INTRAVENOUS

## 2024-05-08 MED ORDER — LIDOCAINE 2% (20 MG/ML) 5 ML SYRINGE
INTRAMUSCULAR | Status: DC | PRN
  Administered 2024-05-08: 70 mg via INTRAVENOUS

## 2024-05-08 NOTE — Anesthesia Postprocedure Evaluation (Signed)
 Anesthesia Post Note  Patient: Dorinda Stehr Kuenzi  Procedure(s) Performed: COLONOSCOPY EGD (ESOPHAGOGASTRODUODENOSCOPY) DILATION, ESOPHAGUS POLYPECTOMY, INTESTINE  Patient location during evaluation: Phase II Anesthesia Type: MAC Level of consciousness: awake Pain management: pain level controlled Vital Signs Assessment: post-procedure vital signs reviewed and stable Respiratory status: spontaneous breathing and respiratory function stable Cardiovascular status: blood pressure returned to baseline and stable Postop Assessment: no headache and no apparent nausea or vomiting Anesthetic complications: no Comments: Late entry   No notable events documented.   Last Vitals:  Vitals:   05/08/24 0652 05/08/24 0814  BP: 131/86 (!) 112/59  Pulse: 77 75  Resp: 15 15  Temp: 37 C 36.5 C  SpO2: 99% 100%    Last Pain:  Vitals:   05/08/24 0814  TempSrc: Oral  PainSc: 0-No pain                 Yvonna JINNY Bosworth

## 2024-05-08 NOTE — Discharge Instructions (Addendum)
 EGD Discharge instructions Please read the instructions outlined below and refer to this sheet in the next few weeks. These discharge instructions provide you with general information on caring for yourself after you leave the hospital. Your doctor may also give you specific instructions. While your treatment has been planned according to the most current medical practices available, unavoidable complications occasionally occur. If you have any problems or questions after discharge, please call your doctor. ACTIVITY You may resume your regular activity but move at a slower pace for the next 24 hours.  Take frequent rest periods for the next 24 hours.  Walking will help expel (get rid of) the air and reduce the bloated feeling in your abdomen.  No driving for 24 hours (because of the anesthesia (medicine) used during the test).  You may shower.  Do not sign any important legal documents or operate any machinery for 24 hours (because of the anesthesia used during the test).  NUTRITION Drink plenty of fluids.  You may resume your normal diet.  Begin with a light meal and progress to your normal diet.  Avoid alcoholic beverages for 24 hours or as instructed by your caregiver.  MEDICATIONS You may resume your normal medications unless your caregiver tells you otherwise.  WHAT YOU CAN EXPECT TODAY You may experience abdominal discomfort such as a feeling of fullness or "gas" pains.  FOLLOW-UP Your doctor will discuss the results of your test with you.  SEEK IMMEDIATE MEDICAL ATTENTION IF ANY OF THE FOLLOWING OCCUR: Excessive nausea (feeling sick to your stomach) and/or vomiting.  Severe abdominal pain and distention (swelling).  Trouble swallowing.  Temperature over 101 F (37.8 C).  Rectal bleeding or vomiting of blood.    Colonoscopy Discharge Instructions  Read the instructions outlined below and refer to this sheet in the next few weeks. These discharge instructions provide you with  general information on caring for yourself after you leave the hospital. Your doctor may also give you specific instructions. While your treatment has been planned according to the most current medical practices available, unavoidable complications occasionally occur. If you have any problems or questions after discharge, call Dr. Shaaron at [phone]. ACTIVITY You may resume your regular activity, but move at a slower pace for the next 24 hours.  Take frequent rest periods for the next 24 hours.  Walking will help get rid of the air and reduce the bloated feeling in your belly (abdomen).  No driving for 24 hours (because of the medicine (anesthesia) used during the test).   Do not sign any important legal documents or operate any machinery for 24 hours (because of the anesthesia used during the test).  NUTRITION Drink plenty of fluids.  You may resume your normal diet as instructed by your doctor.  Begin with a light meal and progress to your normal diet. Heavy or fried foods are harder to digest and may make you feel sick to your stomach (nauseated).  Avoid alcoholic beverages for 24 hours or as instructed.  MEDICATIONS You may resume your normal medications unless your doctor tells you otherwise.  WHAT YOU CAN EXPECT TODAY Some feelings of bloating in the abdomen.  Passage of more gas than usual.  Spotting of blood in your stool or on the toilet paper.  IF YOU HAD POLYPS REMOVED DURING THE COLONOSCOPY: No aspirin products for 7 days or as instructed.  No alcohol for 7 days or as instructed.  Eat a soft diet for the next 24 hours.  FINDING  OUT THE RESULTS OF YOUR TEST Not all test results are available during your visit. If your test results are not back during the visit, make an appointment with your caregiver to find out the results. Do not assume everything is normal if you have not heard from your caregiver or the medical facility. It is important for you to follow up on all of your test  results.  SEEK IMMEDIATE MEDICAL ATTENTION IF: You have more than a spotting of blood in your stool.  Your belly is swollen (abdominal distention).  You are nauseated or vomiting.  You have a temperature over 101.  You have abdominal pain or discomfort that is severe or gets worse throughout the day.    You did have a narrowed esophagus.  Dilation performed  Small hiatal hernia.  Stomach slightly inflamed biopsies of the esophagus and stomach taken  for the next 3 months increase pantoprazole  to 40 mg twice daily before breakfast and supper.  New prescription called in   To your pharmacy. 3 months   2 colon polyps found and removed   further recommendations to follow pending review of pathology report   Office visit with Therisa Stager in  3 months

## 2024-05-08 NOTE — H&P (Signed)
 @LOGO @   Gastroenterology Progress Note    Primary Care Physician:  Myrla Jon HERO, MD Primary Gastroenterologist:  Dr. Shaaron  Pre-Procedure History & Physical: HPI:  Diane Curry is a 48 y.o. female here for further evaluation of GERD esophageal dysphagia via EGD possible dilation.  History of H. pylori treated but not eradication not proven.  First-ever average risk screening colonoscopy.  Past Medical History:  Diagnosis Date   Anemia    Menorrhagia    Migraines    without aura   Vitamin D  deficiency     Past Surgical History:  Procedure Laterality Date   DILATION AND CURETTAGE OF UTERUS  2013   SAB at 10-12 weeks, warranted PRBC and admission overnight. [place]   harrington rod  1987   PILONIDAL CYST EXCISION  2000   Dr.Byrnette   SPINE SURGERY  1986   Harrington rods for scolosis   TONSILLECTOMY  1992    Prior to Admission medications   Medication Sig Start Date End Date Taking? Authorizing Provider  acetaminophen  (TYLENOL ) 325 MG tablet Take 650 mg by mouth every 6 (six) hours as needed.   Yes [provider]  doxycycline  (VIBRA -TABS) 100 MG tablet Take 1 tablet (100 mg total) by mouth 2 (two) times daily for 7 days. 05/02/24 05/09/24 Yes Bacigalupo, Angela M, MD  estradiol (VIVELLE-DOT) 0.025 MG/24HR Place 1 patch onto the skin 2 (two) times a week. 03/23/24 03/23/25 Yes [provider]  ibuprofen  (ADVIL ,MOTRIN ) 200 MG tablet Take 200 mg by mouth every 6 (six) hours as needed.   Yes [provider]  loratadine (CLARITIN) 10 MG tablet Take 10 mg by mouth daily.   Yes [provider]  metaxalone  (SKELAXIN ) 800 MG tablet Take 1 tablet (800 mg total) by mouth 3 (three) times daily as needed for muscle spasms. 05/02/24  Yes Bacigalupo, Jon HERO, MD  pantoprazole  (PROTONIX ) 40 MG tablet Take 1 tablet (40 mg total) by mouth daily. 30 minutes before breakfast 04/04/24  Yes Shirlean Therisa ORN, NP  progesterone (PROMETRIUM) 100 MG  capsule Take 100 mg by mouth at bedtime. 03/22/24 03/22/25 Yes [provider]  Sod Picosulfate-Mag Ox-Cit Acd (CLENPIQ ) 10-3.5-12 MG-GM -GM/175ML SOLN Take 1 kit by mouth as directed. 04/04/24  Yes Tate Zagal, Lamar HERO, MD  SUMAtriptan  (IMITREX ) 100 MG tablet Take 1 tablet (100 mg total) by mouth once as needed for up to 1 dose. May repeat in 2 hours if headache persists or recurs. 05/02/24  Yes Bacigalupo, Jon HERO, MD  Multiple Vitamins-Minerals (WOMENS MULTIVITAMIN PO) Take by mouth.    [provider]    Allergies as of 04/04/2024   (No Known Allergies)    Family History  Problem Relation Age of Onset   Osteoporosis Mother    Diabetes Father        non- insulin Dependent   Hypertension Father    Stroke Father    Other Sister        produces extra CSF that causes visual abnormalities   Hypertension Maternal Grandfather    Heart disease Maternal Grandfather    Breast cancer Paternal Grandmother 68   Cancer Paternal Grandmother    Stroke Paternal Grandfather    Hypertension Paternal Grandfather    Colon cancer Neg Hx    Ovarian cancer Neg Hx    Cervical cancer Neg Hx     Social History   Socioeconomic History   Marital status: Married    Spouse name: [name]   Number of children:  3   Years of education: 66   Highest education level: Master's degree (e.g., MA, MS, MEng, MEd, MSW, MBA)  Occupational History    Comment: [employer]   Occupation: student    Comment: [employer] 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  Tobacco Use   Smoking status: Never   Smokeless tobacco: Never  Vaping Use   Vaping status: Never Used  Substance and Sexual Activity   Alcohol use: Yes    Comment: Once per month; wine or mixed drinks   Drug use: Never   Sexual activity: Yes  Other Topics Concern   Not on file  Social History Narrative   Not on file   Social Drivers of Health   Financial Resource Strain: Low Risk  (05/02/2024)   Overall Financial Resource Strain  (CARDIA)    Difficulty of Paying Living Expenses: Not very hard  Food Insecurity: No Food Insecurity (05/02/2024)   Hunger Vital Sign    Worried About Running Out of Food in the Last Year: Never true    Ran Out of Food in the Last Year: Never true  Transportation Needs: No Transportation Needs (05/02/2024)   PRAPARE - Administrator, Civil Service (Medical): No    Lack of Transportation (Non-Medical): No  Physical Activity: Sufficiently Active (05/02/2024)   Exercise Vital Sign    Days of Exercise per Week: 5 days    Minutes of Exercise per Session: 30 min  Stress: No Stress Concern Present (05/02/2024)   [employer] of Occupational Health - Occupational Stress Questionnaire    Feeling of Stress: Only a little  Social Connections: Socially Integrated (05/02/2024)   Social Connection and Isolation Panel    Frequency of Communication with Friends and Family: Three times a week    Frequency of Social Gatherings with Friends and Family: Once a week    Attends Religious Services: More than 4 times per year    Active Member of [employer] or Organizations: Yes    Attends Engineer, Structural: More than 4 times per year    Marital Status: Married  Catering Manager Violence: Not on file    Review of Systems   See HPI, otherwise negative ROS  Physical Exam: BP 131/86   Pulse 77   Temp 98.6 F (37 C) (Oral)   Resp 15   Ht 5' 3 (1.6 m)   Wt 72.6 kg   SpO2 99%   BMI 28.34 kg/m  General:   Alert,  Well-developed, well-nourished, pleasant and cooperative in NAD Neck:  Supple; no masses or thyromegaly. No significant cervical adenopathy. Lungs:  Clear throughout to auscultation.   No wheezes, crackles, or rhonchi. No acute distress. Heart:  Regular rate and rhythm; no murmurs, clicks, rubs,  or gallops. Abdomen: Non-distended, normal bowel sounds.  Soft and nontender without appreciable mass or hepatosplenomegaly.    Impression/Plan:    48 year old lady here for  first-ever average her screening colonoscopy  GERD chronic cough poorly controlled intermittent PPI therapy.  Intermittent esophageal dysphagia history of H. pylori treated but eradication not proven.  Regular NSAID use.  Of offered patient EGD with esophageal dilation followed by screening colonoscopy per plan. The risks, benefits, limitations, imponderables and alternatives regarding both EGD and colonoscopy have been reviewed with the patient. Questions have been answered. All parties agreeable.     Notice: This dictation was prepared with Dragon dictation along with smaller phrase technology. Any transcriptional errors that result from this process are unintentional and  may not be corrected upon review.

## 2024-05-08 NOTE — Transfer of Care (Signed)
 Immediate Anesthesia Transfer of Care Note  Patient: Diane Curry  Procedure(s) Performed: COLONOSCOPY EGD (ESOPHAGOGASTRODUODENOSCOPY) DILATION, ESOPHAGUS POLYPECTOMY, INTESTINE  Patient Location: Endoscopy Unit  Anesthesia Type:MAC  Level of Consciousness: awake, alert , oriented, and patient cooperative  Airway & Oxygen Therapy: Patient Spontanous Breathing  Post-op Assessment: Report given to RN, Post -op Vital signs reviewed and stable, and Patient moving all extremities X 4  Post vital signs: Reviewed and stable  Last Vitals:  Vitals Value Taken Time  BP 112/59 05/08/24 08:14  Temp 36.5 C 05/08/24 08:14  Pulse 75 05/08/24 08:14  Resp 15 05/08/24 08:14  SpO2 100 % 05/08/24 08:14    Last Pain:  Vitals:   05/08/24 0814  TempSrc: Oral  PainSc: 0-No pain      Patients Stated Pain Goal: 6 (05/08/24 9347)  Complications: No notable events documented.

## 2024-05-08 NOTE — Op Note (Signed)
 [facility] Patient Name: Diane Curry Procedure Date: 05/08/2024 7:17 AM MRN: [id number] Date of Birth: 12-Apr-1976 Attending MD: Lamar Ozell Hollingshead , MD, [id number] CSN: [id number] Age: 48 Admit Type: Outpatient Procedure:                Upper GI endoscopy Indications:              Dysphagia Providers:                Lamar Ozell Hollingshead, MD, Madelin Hunter, RN, Bascom Blush Referring MD:              Medicines:                Propofol  per Anesthesia Complications:            No immediate complications. Estimated Blood Loss:     Estimated blood loss was minimal. Procedure:                Pre-Anesthesia Assessment:                           - Prior to the procedure, a History and Physical                            was performed, and patient medications and                            allergies were reviewed. The patient's tolerance of                            previous anesthesia was also reviewed. The risks                            and benefits of the procedure and the sedation                            options and risks were discussed with the patient.                            All questions were answered, and informed consent                            was obtained. Prior Anticoagulants: The patient has                            taken no anticoagulant or antiplatelet agents. ASA                            Grade Assessment: II - A patient with mild systemic                            disease. After reviewing the risks and benefits,  the patient was deemed in satisfactory condition to                            undergo the procedure.                           After obtaining informed consent, the endoscope was                            passed under direct vision. Throughout the                            procedure, the patient's blood pressure, pulse, and                            oxygen saturations were monitored  continuously. The                            [id number] ([id number]) scope was introduced through                            the mouth, and advanced to the second part of                            duodenum. The upper GI endoscopy was accomplished                            without difficulty. The patient tolerated the                            procedure well. Scope In: 7:38:35 AM Scope Out: 7:46:08 AM Total Procedure Duration: 0 hours 7 minutes 33 seconds  Findings:      (2) tandem rings GE junction. Noncritical. Nonobstructing. Tubular       esophagus otherwise      A small hiatal hernia was present. Patchy antral erythema. No ulcer or       infiltrating process.      The duodenal bulb and second portion of the duodenum were normal. The       scope was withdrawn. Dilation was performed with a Maloney dilator with       mild resistance at 54 Fr. The dilation site was examined following       endoscope reinsertion and showed no change. Estimated blood loss: none.       Finally, biopsies abnormal gastric mucosa and mid and distal esophagus       were taken for histologic study Impression:               - Mild Schatzki ring. Dilated and esophagus biopsied                           - Small hiatal hernia. gastric erythema?"status post                            biopsy                           -  Normal duodenal bulb and second portion of the                            duodenum.                           - patient states in 1 month her coughing has                            notably improved . May have an element of LPR Moderate Sedation:      Moderate (conscious) sedation was personally administered by an       anesthesia professional. The following parameters were monitored: oxygen       saturation, heart rate, blood pressure, respiratory rate, EKG, adequacy       of pulmonary ventilation, and response to care. Recommendation:           - Patient has a contact number available for                             emergencies. The signs and symptoms of potential                            delayed complications were discussed with the                            patient. Return to normal activities tomorrow.                            Written discharge instructions were provided to the                            patient.                           - Advance diet as tolerated.                           - Continue present medications. Increase                            pantoprazole  to 40 mg twice daily best taken before                            meals for the next 3 months. Follow-up on                            pathology. Office visit with us  in 3 months. See                            colonoscopy report Procedure Code(s):        --- Professional ---                           [phone], Esophagogastroduodenoscopy, flexible,  transoral; diagnostic, including collection of                            specimen(s) by brushing or washing, when performed                            (separate procedure)                           43450, Dilation of esophagus, by unguided sound or                            bougie, single or multiple passes Diagnosis Code(s):        --- Professional ---                           K22.2, Esophageal obstruction                           K44.9, Diaphragmatic hernia without obstruction or                            gangrene                           R13.10, Dysphagia, unspecified CPT copyright 2022 [employer]. All rights reserved. The codes documented in this report are preliminary and upon coder review may  be revised to meet current compliance requirements. Lamar HERO. Enda Santo, MD Lamar Ozell Hollingshead, MD 05/08/2024 8:22:40 AM This report has been signed electronically. Number of Addenda: 0

## 2024-05-08 NOTE — Op Note (Signed)
 [facility] Patient Name: Diane Curry Procedure Date: 05/08/2024 7:11 AM MRN: [id number] Date of Birth: Nov 28, 1975 Attending MD: Lamar Ozell Hollingshead , MD, [id number] CSN: [id number] Age: 48 Admit Type: Outpatient Procedure:                Colonoscopy Indications:              Screening for colorectal malignant neoplasm Providers:                Lamar Ozell Hollingshead, MD, Madelin Hunter, RN, Bascom Blush Referring MD:              Medicines:                Propofol  per Anesthesia Complications:            No immediate complications. Estimated Blood Loss:     Estimated blood loss was minimal. Procedure:                Pre-Anesthesia Assessment:                           - Prior to the procedure, a History and Physical                            was performed, and patient medications and                            allergies were reviewed. The patient's tolerance of                            previous anesthesia was also reviewed. The risks                            and benefits of the procedure and the sedation                            options and risks were discussed with the patient.                            All questions were answered, and informed consent                            was obtained. Prior Anticoagulants: The patient has                            taken no anticoagulant or antiplatelet agents. ASA                            Grade Assessment: III - A patient with severe                            systemic disease. After reviewing the risks and  benefits, the patient was deemed in satisfactory                            condition to undergo the procedure.                           After obtaining informed consent, the colonoscope                            was passed under direct vision. Throughout the                            procedure, the patient's blood pressure, pulse, and                             oxygen saturations were monitored continuously. The                            [id number]-[id number] ([id number]) Colon was introduced through                            the anus and advanced to the the cecum, identified                            by appendiceal orifice and ileocecal valve. The                            colonoscopy was performed without difficulty. The                            patient tolerated the procedure well. The quality                            of the bowel preparation was adequate. The                            ileocecal valve, appendiceal orifice, and rectum                            were photographed. Scope In: 7:54:12 AM Scope Out: 8:09:53 AM Scope Withdrawal Time: 0 hours 9 minutes 33 seconds  Total Procedure Duration: 0 hours 15 minutes 41 seconds  Findings:      The perianal and digital rectal examinations were normal.      An 8 mm polyp was found in the cecum. The polyp was sessile. The polyp       was removed with a hot snare. Resection and retrieval were complete.       Estimated blood loss: none.      A 5 mm polyp was found in the cecum. The polyp was sessile. The polyp       was removed with a cold snare. Resection and retrieval were complete.       Estimated blood loss was minimal.      The exam was otherwise without abnormality on direct and retroflexion       views. Impression:               -  One 8 mm polyp in the cecum, removed with a hot                            snare. Resected and retrieved.                           - One 5 mm polyp in the cecum, removed with a cold                            snare. Resected and retrieved.                           - The examination was otherwise normal on direct                            and retroflexion views. Moderate Sedation:      Moderate (conscious) sedation was personally administered by an       anesthesia professional. The following parameters were monitored: oxygen       saturation, heart rate, blood  pressure, respiratory rate, EKG, adequacy       of pulmonary ventilation, and response to care. Recommendation:           - Patient has a contact number available for                            emergencies. The signs and symptoms of potential                            delayed complications were discussed with the                            patient. Return to normal activities tomorrow.                            Written discharge instructions were provided to the                            patient.                           - Advance diet as tolerated.                           - Continue present medications.                           - Repeat colonoscopy date to be determined after                            pending pathology results are reviewed for                            surveillance based on pathology results.                           -  Return to GI office in 3 months. See EGD report. Procedure Code(s):        --- Professional ---                           [id number], Colonoscopy, flexible; with removal of                            tumor(s), polyp(s), or other lesion(s) by snare                            technique Diagnosis Code(s):        --- Professional ---                           Z12.11, Encounter for screening for malignant                            neoplasm of colon                           D12.0, Benign neoplasm of cecum CPT copyright 2022 American [employer] Association. All rights reserved. The codes documented in this report are preliminary and upon coder review may  be revised to meet current compliance requirements. Lamar HERO. Ionia Schey, MD Lamar Ozell Hollingshead, MD 05/08/2024 8:26:08 AM This report has been signed electronically. Number of Addenda: 0

## 2024-05-08 NOTE — Anesthesia Preprocedure Evaluation (Signed)
 Anesthesia Evaluation  Patient identified by MRN, date of birth, ID band Patient awake    Reviewed: Allergy & Precautions, H&P , NPO status , Patient's Chart, lab work & pertinent test results, reviewed documented beta blocker date and time   Airway Mallampati: II  TM Distance: >3 FB Neck ROM: full    Dental no notable dental hx.    Pulmonary neg pulmonary ROS   Pulmonary exam normal breath sounds clear to auscultation       Cardiovascular Exercise Tolerance: Good hypertension, negative cardio ROS  Rhythm:regular Rate:Normal     Neuro/Psych  Headaches  negative psych ROS   GI/Hepatic Neg liver ROS,GERD  ,,  Endo/Other  negative endocrine ROS    Renal/GU negative Renal ROS  negative genitourinary   Musculoskeletal   Abdominal   Peds  Hematology  (+) Blood dyscrasia, anemia   Anesthesia Other Findings   Reproductive/Obstetrics negative OB ROS                              Anesthesia Physical Anesthesia Plan  ASA: 2  Anesthesia Plan: MAC   Post-op Pain Management:    Induction:   PONV Risk Score and Plan: Propofol  infusion  Airway Management Planned:   Additional Equipment:   Intra-op Plan:   Post-operative Plan:   Informed Consent: I have reviewed the patients History and Physical, chart, labs and discussed the procedure including the risks, benefits and alternatives for the proposed anesthesia with the patient or authorized representative who has indicated his/her understanding and acceptance.     Dental Advisory Given  Plan Discussed with: CRNA  Anesthesia Plan Comments:         Anesthesia Quick Evaluation

## 2024-05-09 ENCOUNTER — Encounter (HOSPITAL_COMMUNITY): Payer: Self-pay | Admitting: Internal Medicine

## 2024-05-09 ENCOUNTER — Encounter: Payer: Self-pay | Admitting: Orthopedic Surgery

## 2024-05-09 NOTE — Telephone Encounter (Signed)
 Scoliosis xrays dated 05/01/24:  FINDINGS: Frontal and lateral views of the thoracolumbar spine are obtained. Postsurgical changes are seen from posterior thoracolumbar spine fusion spanning T8 through L5. There is 67 degrees of right convex scoliosis utilizing the superior endplate of T4 and the inferior endplate of T11 as bony landmarks. There are no acute displaced fractures. Mild midthoracic spondylosis. Paraspinal soft tissues are unremarkable. Cardiac silhouette is unremarkable. The lungs are clear. Bowel gas pattern is unremarkable.   IMPRESSION: 1. Right convex thoracic scoliosis measuring 67 degrees. 2. Postsurgical changes from prior thoracolumbar spinal fusion spanning T8 through L5.     Electronically Signed   By: Ozell Daring M.D.   On: 05/08/2024 21:32  I have personally reviewed the images and agree with the above interpretation.

## 2024-05-10 LAB — SURGICAL PATHOLOGY

## 2024-05-17 ENCOUNTER — Ambulatory Visit: Payer: Self-pay | Admitting: Internal Medicine

## 2024-06-01 ENCOUNTER — Encounter: Payer: Self-pay | Admitting: Oncology

## 2024-06-02 ENCOUNTER — Encounter: Payer: Self-pay | Admitting: Oncology

## 2024-06-05 ENCOUNTER — Telehealth: Payer: Self-pay

## 2024-06-05 DIAGNOSIS — E782 Mixed hyperlipidemia: Secondary | ICD-10-CM

## 2024-06-05 NOTE — Telephone Encounter (Signed)
 Copied from CRM [id number]. Topic: General - Other >> Jun 05, 2024  7:46 AM Lauraine BROCKS wrote: Reason for CRM: [employer] would like to speak peer to peer for approval of CT Phone [phone] Tracking # [id number]

## 2024-06-05 NOTE — Telephone Encounter (Signed)
 Copied from CRM #[id number]. Topic: General - Other >> Jun 02, 2024 10:37 AM Lauraine BROCKS wrote: Reason for CRM:  [employer] would like to speak peer to peer for approval of CT Phone [phone] Tracking # [id number]

## 2024-06-05 NOTE — Telephone Encounter (Signed)
 Pt advised of appt change. Verbalized understanding and aware to arrive at 2:00 pm instead of 1:30 pm

## 2024-06-05 NOTE — Telephone Encounter (Signed)
 duplicate

## 2024-06-05 NOTE — Telephone Encounter (Signed)
 Advised appt is for incorrect order and would like to see if this could be corrected as I am not able to cancel order since appt was already scheduled. They verbalized understanding and updated appt with the correct order. Appt time moved to 2:15 and patient should arrive at 2:00 pm. I verbalized understanding.

## 2024-06-08 ENCOUNTER — Ambulatory Visit: Payer: Self-pay | Admitting: Family Medicine

## 2024-06-08 ENCOUNTER — Other Ambulatory Visit

## 2024-06-08 ENCOUNTER — Ambulatory Visit
Admission: RE | Admit: 2024-06-08 | Discharge: 2024-06-08 | Disposition: A | Discharge status: Home / Self Care | Payer: Self-pay | Source: Ambulatory Visit | Attending: Family Medicine | Admitting: Family Medicine

## 2024-06-08 DIAGNOSIS — E782 Mixed hyperlipidemia: Secondary | ICD-10-CM | POA: Insufficient documentation

## 2024-06-27 ENCOUNTER — Ambulatory Visit
Admission: RE | Admit: 2024-06-27 | Discharge: 2024-06-27 | Disposition: A | Discharge status: Home / Self Care | Source: Ambulatory Visit | Attending: Family Medicine | Admitting: Family Medicine

## 2024-06-27 DIAGNOSIS — Z8262 Family history of osteoporosis: Secondary | ICD-10-CM | POA: Diagnosis present

## 2024-06-27 DIAGNOSIS — Z0000 Encounter for general adult medical examination without abnormal findings: Secondary | ICD-10-CM | POA: Diagnosis present

## 2024-07-27 ENCOUNTER — Other Ambulatory Visit

## 2024-07-28 ENCOUNTER — Ambulatory Visit: Admitting: Oncology

## 2024-07-28 ENCOUNTER — Other Ambulatory Visit

## 2024-11-02 ENCOUNTER — Ambulatory Visit: Admitting: Family Medicine

## 2025-05-03 ENCOUNTER — Encounter: Admitting: Family Medicine
